# Patient Record
Sex: Female | Born: 1995 | Race: White | Hispanic: No | Marital: Single | Smoking: Never smoker
Health system: Southern US, Community
[De-identification: ages and names within clinical notes are randomized; demographics above are authoritative.]

## PROBLEM LIST (undated history)

## (undated) ENCOUNTER — Emergency Department (HOSPITAL_COMMUNITY): Admission: EM | Payer: BC Managed Care – PPO | Source: Home / Self Care

## (undated) DIAGNOSIS — F419 Anxiety disorder, unspecified: Secondary | ICD-10-CM

## (undated) DIAGNOSIS — F329 Major depressive disorder, single episode, unspecified: Secondary | ICD-10-CM

## (undated) DIAGNOSIS — F32A Depression, unspecified: Secondary | ICD-10-CM

## (undated) DIAGNOSIS — J45909 Unspecified asthma, uncomplicated: Secondary | ICD-10-CM

## (undated) DIAGNOSIS — K219 Gastro-esophageal reflux disease without esophagitis: Secondary | ICD-10-CM

## (undated) HISTORY — PX: HYMENECTOMY: SHX987

## (undated) HISTORY — PX: OTHER SURGICAL HISTORY: SHX169

## (undated) HISTORY — DX: Unspecified asthma, uncomplicated: J45.909

## (undated) HISTORY — PX: PILONIDAL CYST EXCISION: SHX744

## (undated) HISTORY — DX: Gastro-esophageal reflux disease without esophagitis: K21.9

---

## 2018-07-10 ENCOUNTER — Encounter (HOSPITAL_COMMUNITY): Payer: Self-pay

## 2018-07-10 ENCOUNTER — Other Ambulatory Visit: Payer: Self-pay

## 2018-07-10 ENCOUNTER — Inpatient Hospital Stay (HOSPITAL_COMMUNITY)
Admission: RE | Admit: 2018-07-10 | Discharge: 2018-07-13 | DRG: 885 | Disposition: A | Discharge status: Home / Self Care | Payer: No Typology Code available for payment source | Attending: Psychiatry | Admitting: Psychiatry

## 2018-07-10 DIAGNOSIS — R45851 Suicidal ideations: Secondary | ICD-10-CM | POA: Diagnosis present

## 2018-07-10 DIAGNOSIS — G4700 Insomnia, unspecified: Secondary | ICD-10-CM | POA: Diagnosis present

## 2018-07-10 DIAGNOSIS — F419 Anxiety disorder, unspecified: Secondary | ICD-10-CM | POA: Diagnosis not present

## 2018-07-10 DIAGNOSIS — J45909 Unspecified asthma, uncomplicated: Secondary | ICD-10-CM | POA: Diagnosis present

## 2018-07-10 DIAGNOSIS — F322 Major depressive disorder, single episode, severe without psychotic features: Principal | ICD-10-CM | POA: Diagnosis present

## 2018-07-10 DIAGNOSIS — F329 Major depressive disorder, single episode, unspecified: Secondary | ICD-10-CM | POA: Diagnosis present

## 2018-07-10 DIAGNOSIS — Z818 Family history of other mental and behavioral disorders: Secondary | ICD-10-CM

## 2018-07-10 DIAGNOSIS — F1210 Cannabis abuse, uncomplicated: Secondary | ICD-10-CM | POA: Diagnosis present

## 2018-07-10 DIAGNOSIS — F410 Panic disorder [episodic paroxysmal anxiety] without agoraphobia: Secondary | ICD-10-CM | POA: Diagnosis present

## 2018-07-10 HISTORY — DX: Major depressive disorder, single episode, unspecified: F32.9

## 2018-07-10 HISTORY — DX: Anxiety disorder, unspecified: F41.9

## 2018-07-10 HISTORY — DX: Depression, unspecified: F32.A

## 2018-07-10 MED ORDER — HYDROXYZINE HCL 25 MG PO TABS
25.0000 mg | ORAL_TABLET | Freq: Three times a day (TID) | ORAL | Status: DC | PRN
  Administered 2018-07-11: 25 mg via ORAL
  Filled 2018-07-10 (×2): qty 10
  Filled 2018-07-10: qty 1

## 2018-07-10 MED ORDER — ESCITALOPRAM OXALATE 20 MG PO TABS
20.0000 mg | ORAL_TABLET | Freq: Every day | ORAL | Status: DC
  Administered 2018-07-11: 20 mg via ORAL
  Filled 2018-07-10 (×2): qty 1

## 2018-07-10 MED ORDER — ACETAMINOPHEN 325 MG PO TABS: 650.0000 mg | ORAL_TABLET | Freq: Four times a day (QID) | ORAL | Status: DC | PRN

## 2018-07-10 MED ORDER — TRAZODONE HCL 50 MG PO TABS: 50.0000 mg | ORAL_TABLET | Freq: Every evening | ORAL | Status: DC | PRN

## 2018-07-10 MED ORDER — ALBUTEROL SULFATE (5 MG/ML) 0.5% IN NEBU
3.0000 mL | INHALATION_SOLUTION | RESPIRATORY_TRACT | Status: DC | PRN
  Filled 2018-07-10: qty 3

## 2018-07-10 MED ORDER — MAGNESIUM HYDROXIDE 400 MG/5ML PO SUSP: 30.0000 mL | Freq: Every day | ORAL | Status: DC | PRN

## 2018-07-10 MED ORDER — LORATADINE 10 MG PO TABS
10.0000 mg | ORAL_TABLET | Freq: Every day | ORAL | Status: DC
  Administered 2018-07-11 – 2018-07-13 (×3): 10 mg via ORAL
  Filled 2018-07-10 (×5): qty 1

## 2018-07-10 MED ORDER — ALUM & MAG HYDROXIDE-SIMETH 200-200-20 MG/5ML PO SUSP: 30.0000 mL | ORAL | Status: DC | PRN

## 2018-07-10 NOTE — Progress Notes (Signed)
Adult Psychoeducational Group Note  Date:  07/10/2018 Time:  9:01 PM  Group Topic/Focus:  Wrap-Up Group:   The focus of this group is to help patients review their daily goal of treatment and discuss progress on daily workbooks.  Participation Level:  Active  Participation Quality:  Appropriate  Affect:  Appropriate  Cognitive:  Alert  Insight: Appropriate  Engagement in Group:  Engaged  Modes of Intervention:  Discussion  Additional Comments:  Patient stated having a good day. Patient's goal is to work on herself.   Margrette Wynia L Gerica Koble 07/10/2018, 9:01 PM

## 2018-07-10 NOTE — Tx Team (Signed)
Initial Treatment Plan 07/10/2018 7:30 PM Ambriella Hofacker OMB:[id number]    PATIENT STRESSORS: Educational Concerns Financial Difficulties Health Issues   PATIENT STRENGTHS: Ability for insight Active sense of humor Average or above average intelligence Capable of independent living Communication skills Financial means General fund of knowledge Motivation for treatment/growth Physical Health Special hobby/interest Supportive family/friends Work skills   PATIENT IDENTIFIED PROBLEMS: "work on Pharmacologist"  Suicide Risk  Depression                 DISCHARGE CRITERIA:  Ability to meet basic life and health needs Adequate post-discharge living arrangements Improved stabilization in mood, thinking, and/or behavior Medical problems require only outpatient monitoring Motivation to continue treatment in a less acute level of care Need for constant or close observation no longer present Reduction of life-threatening or endangering symptoms to within safe limits Safe-care adequate arrangements made Verbal commitment to aftercare and medication compliance  PRELIMINARY DISCHARGE PLAN: Outpatient therapy  PATIENT/FAMILY INVOLVEMENT: This treatment plan has been presented to and reviewed with the patient, Theone Murdoch.  The patient and family have been given the opportunity to ask questions and make suggestions.  Ferrel Logan, RN 07/10/2018, 7:30 PM

## 2018-07-10 NOTE — Progress Notes (Addendum)
D: Pt was in hallway upon initial approach.  Pt presents with depressed affect and mood.  She reports her day "could be better."  Pt denies having a goal so Probation officer and pt made goal for pt to sleep well and be safe.  Pt denies SI/HI, denies hallucinations, denies pain.  Pt has been visible in milieu interacting with peers and staff appropriately.  Pt attended evening group.    A: Introduced self to pt.  Met with pt 1:1.  Actively listened to pt and offered support and encouragement.  Urine specimen obtained.  Q15 minute safety checks maintained.  R: Pt is safe on the unit.  Pt verbally contracts for safety.  Will continue to monitor and assess.

## 2018-07-10 NOTE — H&P (Signed)
Behavioral Health Medical Screening Exam  Wanda Huynh is an 23 y.o. female.  Total Time spent with patient: 20 minutes  Psychiatric Specialty Exam: Physical Exam  Nursing note and vitals reviewed. Constitutional: She is oriented to person, place, and time. She appears well-developed and well-nourished.  Cardiovascular: Normal rate.  Respiratory: Effort normal.  Musculoskeletal: Normal range of motion.  Neurological: She is alert and oriented to person, place, and time.  Skin: Skin is warm.    Review of Systems  Constitutional: Negative.   HENT: Negative.   Eyes: Negative.   Respiratory: Negative.   Cardiovascular: Negative.   Gastrointestinal: Negative.   Genitourinary: Negative.   Musculoskeletal: Negative.   Skin: Negative.   Neurological: Negative.   Endo/Heme/Allergies: Negative.   Psychiatric/Behavioral: Positive for depression and suicidal ideas.    Blood pressure (!) 143/100, pulse 82, temperature 98.2 F (36.8 C), resp. rate 16, SpO2 99 %.There is no height or weight on file to calculate BMI.  General Appearance: Disheveled  Eye Contact:  Fair  Speech:  Clear and Coherent and Normal Rate  Volume:  Decreased  Mood:  Depressed  Affect:  Depressed and Flat  Thought Process:  Linear and Descriptions of Associations: Intact  Orientation:  Full (Time, Place, and Person)  Thought Content:  WDL  Suicidal Thoughts:  Yes.  with intent/plan  Homicidal Thoughts:  No  Memory:  Immediate;   Good Recent;   Good Remote;   Good  Judgement:  Fair  Insight:  Fair  Psychomotor Activity:  Normal  Concentration: Concentration: Good and Attention Span: Good  Recall:  Good  Fund of Knowledge:Good  Language: Good  Akathisia:  No  Handed:  Right  AIMS (if indicated):     Assets:  Communication Skills Desire for Improvement Financial Resources/Insurance Housing Physical Health Social Support Transportation  Sleep:       Musculoskeletal: Strength & Muscle  Tone: within normal limits Gait & Station: normal Patient leans: N/A  Blood pressure (!) 143/100, pulse 82, temperature 98.2 F (36.8 C), resp. rate 16, SpO2 99 %.  Recommendations:  Based on my evaluation the patient does not appear to have an emergency medical condition.  Wanda Burdock Georgeanne Frankland, FNP 07/10/2018, 5:10 PM

## 2018-07-10 NOTE — Progress Notes (Signed)
Patient ID: Wanda Huynh, female   DOB: 02-06-1996, 23 y.o.   MRN: [id number]  Admission Note  D) Patient admitted to the adult unit 400 hall. Patient is 23 year old female who is voluntary as a walk-in. 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 Patient reports she had no plan. Patient denies HI/AVH. Patient states she had a history of depression and anxiety and is currently prescribed Lexapro and Vistaril. Patient reports she became overwhelmed with school and life and felt like "ending it all". Patient denies legal issues or allergies to food/medicine. Patient states she is being treated for bronchitis. Patient states her goals for treatment are to "work on coping skills". Patient reports she did not expect to be admitted to [facility] and is anxious about being here. Emotional support provided.  Skin assessment was completed and unremarkable except for several small tattoos. Patient had no belongings secured on admission. Vital signs obtained. Snacks and fluids offered.    A) Plan of care, unit policies and patient expectations were explained. Written consents obtained. Patient oriented to the unit and their room. Patient placed on standard q15 safety checks. Low fall risk precautions initiated and reviewed with patient.   R) Patient is in no acute distress and verbalizes understanding of information provided. Patient with no concerns at this time. Patient contracts for safety with staff on the unit.
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
## 2018-07-10 NOTE — Progress Notes (Signed)
Patient ID: Wanda Huynh, female   DOB: [birth year], 23 y.o.   MRN: [id number]  Urine specimen cup provided to patient for collection.

## 2018-07-11 DIAGNOSIS — G4700 Insomnia, unspecified: Secondary | ICD-10-CM

## 2018-07-11 DIAGNOSIS — F419 Anxiety disorder, unspecified: Secondary | ICD-10-CM

## 2018-07-11 DIAGNOSIS — F322 Major depressive disorder, single episode, severe without psychotic features: Principal | ICD-10-CM

## 2018-07-11 LAB — CBC
HCT: 49.9 % — ABNORMAL HIGH (ref 36.0–46.0)
Hemoglobin: 16.3 g/dL — ABNORMAL HIGH (ref 12.0–15.0)
MCH: 32.7 pg (ref 26.0–34.0)
MCHC: 32.7 g/dL (ref 30.0–36.0)
MCV: 100 fL (ref 80.0–100.0)
NRBC: 0 % (ref 0.0–0.2)
Platelets: 387 10*3/uL (ref 150–400)
RBC: 4.99 MIL/uL (ref 3.87–5.11)
RDW: 11.9 % (ref 11.5–15.5)
WBC: 7.7 10*3/uL (ref 4.0–10.5)

## 2018-07-11 LAB — PREGNANCY, URINE: Preg Test, Ur: NEGATIVE

## 2018-07-11 LAB — COMPREHENSIVE METABOLIC PANEL
ALT: 53 U/L — ABNORMAL HIGH (ref 0–44)
AST: 42 U/L — ABNORMAL HIGH (ref 15–41)
Albumin: 4.5 g/dL (ref 3.5–5.0)
Alkaline Phosphatase: 62 U/L (ref 38–126)
Anion gap: 14 (ref 5–15)
BUN: 17 mg/dL (ref 6–20)
CO2: 25 mmol/L (ref 22–32)
Calcium: 9.8 mg/dL (ref 8.9–10.3)
Chloride: 102 mmol/L (ref 98–111)
Creatinine, Ser: 0.88 mg/dL (ref 0.44–1.00)
GFR calc Af Amer: >60 mL/min (ref >60)
GFR calc non Af Amer: >60 mL/min (ref >60)
Glucose, Bld: 79 mg/dL (ref 70–99)
POTASSIUM: 3.6 mmol/L (ref 3.5–5.1)
Sodium: 141 mmol/L (ref 135–145)
Total Bilirubin: 0.7 mg/dL (ref 0.3–1.2)
Total Protein: 8.4 g/dL — ABNORMAL HIGH (ref 6.5–8.1)

## 2018-07-11 LAB — TSH: TSH: 1.268 u[IU]/mL (ref 0.350–4.500)

## 2018-07-11 LAB — RAPID URINE DRUG SCREEN, HOSP PERFORMED
Amphetamines: NOT DETECTED
BENZODIAZEPINES: NOT DETECTED
Barbiturates: NOT DETECTED
Cocaine: NOT DETECTED
Opiates: NOT DETECTED
Tetrahydrocannabinol: POSITIVE — AB

## 2018-07-11 MED ORDER — MIRTAZAPINE 7.5 MG PO TABS
7.5000 mg | ORAL_TABLET | Freq: Every day | ORAL | Status: DC
  Administered 2018-07-11 – 2018-07-12 (×2): 7.5 mg via ORAL
  Filled 2018-07-11: qty 1
  Filled 2018-07-11: qty 7
  Filled 2018-07-11 (×2): qty 1

## 2018-07-11 MED ORDER — KETOCONAZOLE 2 % EX SHAM: MEDICATED_SHAMPOO | Freq: Every evening | CUTANEOUS | Status: DC | PRN

## 2018-07-11 MED ORDER — ESCITALOPRAM OXALATE 10 MG PO TABS
10.0000 mg | ORAL_TABLET | Freq: Every day | ORAL | Status: DC
  Administered 2018-07-12 – 2018-07-13 (×2): 10 mg via ORAL
  Filled 2018-07-11: qty 1
  Filled 2018-07-11: qty 7
  Filled 2018-07-11 (×3): qty 1

## 2018-07-11 NOTE — Progress Notes (Signed)
D: Pt was in dayroom upon initial approach.  Pt presents with appropriate affect and mood.  She was seen smiling and laughing at times in dayroom tonight.  Describes her day as "pretty good."  Reports she had a good visit with her best friends and parents tonight.  Her goal was to "talk in group therapy.  I met it."  Reports the best part of her day was seeing her best friends.  Pt denies SI/HI, denies hallucinations, denies pain.  Pt has been visible in milieu interacting with peers and staff appropriately.  Pt attended evening group.    A: Met with pt 1:1.  Actively listened to pt and offered support and encouragement. Medication administered per order.  PRN medication administered for scalp irritation.  Q15 minute safety checks maintained.  R: Pt is safe on the unit.  Pt is compliant with medications.  Pt verbally contracts for safety.  Will continue to monitor and assess.

## 2018-07-11 NOTE — [facility] Suicide Risk Assessment (Signed)
[facility] Admission Suicide Risk Assessment   Nursing information obtained from:  Patient, Review of record Demographic factors:  Adolescent or young adult, Caucasian Current Mental Status:  Suicidal ideation indicated by patient, Self-harm thoughts Loss Factors:  NA Historical Factors:  Family history of suicide, Family history of mental illness or substance abuse Risk Reduction Factors:  Living with another person, especially a relative, Positive social support, Sense of responsibility to family, Employed, Positive coping skills or problem solving skills  Total Time spent with patient: 45 minutes Principal Problem: MDD, Suicidal Ideations, Cannabis Use Disorder  Diagnosis:  Active Problems:   MDD (major depressive disorder), severe (HCC)  Subjective Data:  Continued Clinical Symptoms:  Alcohol Use Disorder Identification Test Final Score (AUDIT): 0 The "Alcohol Use Disorders Identification Test", Guidelines for Use in Primary Care, Second Edition.  World Science writer [facility]). Score between 0-7:  no or low risk or alcohol related problems. Score between 8-15:  moderate risk of alcohol related problems. Score between 16-19:  high risk of alcohol related problems. Score 20 or above:  warrants further diagnostic evaluation for alcohol dependence and treatment.   CLINICAL FACTORS:  23 year old female, college student, employed .  Reports history of depression, anxiety, suicidal ideations, endorses some neuro-vegetative symptoms , worsening anxiety. Uses Cannabis on a daily basis.   Psychiatric Specialty Exam: Physical Exam  ROS  Blood pressure 128/75, pulse 79, temperature 97.8 F (36.6 C), temperature source Oral, resp. rate 16, height 5\' 7"  (1.702 m), weight 71.2 kg, SpO2 99 %.Body mass index is 24.59 kg/m.  See admit note MSE   COGNITIVE FEATURES THAT CONTRIBUTE TO RISK:  Closed-mindedness and Loss of executive function    SUICIDE RISK:   Moderate:  Frequent suicidal ideation  with limited intensity, and duration, some specificity in terms of plans, no associated intent, good self-control, limited dysphoria/symptomatology, some risk factors present, and identifiable protective factors, including available and accessible social support.  PLAN OF CARE: Patient will be admitted to inpatient psychiatric unit for stabilization and safety. Will provide and encourage milieu participation. Provide medication management and maked adjustments as needed.  Will follow daily.    I certify that inpatient services furnished can reasonably be expected to improve the patient's condition.   Craige Cotta, MD 07/11/2018, 9:18 AM

## 2018-07-11 NOTE — [facility] Group Notes (Signed)
Adult Psychoeducational Group Note  Date:  07/11/2018 Time:  9:08 PM  Group Topic/Focus:  Wrap-Up Group:   The focus of this group is to help patients review their daily goal of treatment and discuss progress on daily workbooks.  Participation Level:  Active  Participation Quality:  Appropriate  Affect:  Appropriate  Cognitive:  Appropriate  Insight: Good  Engagement in Group:  Engaged  Modes of Intervention:  Discussion  Additional Comments:  Pt rated her day 6 because of her anxiety other than that it was good.  Her goal was to talk during group and goals were met.  Eldine Rencher A 07/11/2018, 9:08 PM
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
## 2018-07-11 NOTE — H&P (Signed)
Psychiatric Admission Assessment Adult  Patient Identification: Wanda Huynh MRN:  [id number] Date of Evaluation:  07/11/2018 Chief Complaint: " I was having suicidal thoughts " Principal Diagnosis: MDD, Cannabis Use Disorder  Diagnosis:  Active Problems:   MDD (major depressive disorder), severe (HCC)  History of Present Illness: 23 year old female, college student, employed. She presented to hospital voluntarily. States she came to hospital at the encouragement of her sister. Reports history of depression and suicidal ideations, which have been occurring for several months, but which she feels have worsened recently. States she has had thoughts of overdosing or of jumping from a moving vehicle.  She denies any suicidal attempts.  In addition to depression she also reports worsening anxiety and states she has been worrying excessively, some panic attacks. Endorses neuro-vegetative symptoms as below, but does not endorse anhedonia. Denies psychotic symptoms. She attributes worsening depression to different factors - states she has feelings for a good friend who did not correspond , her father having " anger problems and being verbally abusive , manipulative".  Reports history of Cannabis Use Disorder, and states she has been smoking cannabis daily.  Associated Signs/Symptoms: Depression Symptoms:  depressed mood, insomnia, suicidal thoughts with specific plan, anxiety, loss of energy/fatigue, decreased appetite, has lost about 12 lbs over the last month (Hypo) Manic Symptoms: does not endorse  Anxiety Symptoms:  She reports increased anxiety, and describes both increased worrying and panic attacks. Psychotic Symptoms: denies  PTSD Symptoms: Does not endorse PTSD symptoms  Total Time spent with patient: 45 minutes  Past Psychiatric History: no prior psychiatric admissions . States she has never attempted suicide and denies any history of self cutting or self injurious  ideations . Denies history of psychosis.  She reports history of depression in the past, but states it became worse over the last year or two.  Reports history of anxiety and panic attacks. Denies agoraphobia. Describes some symptoms of social anxiety. Denies history of mania or hypomania, denies history of Eating Disorder, denies history of violence . She had gone to [facility] and had been tried on Prozac , which she took for a month, but states she did not feel it worked. More recently she was switched to Lexapro, which she has taken for 3-4 weeks. She states she does not feel medication is helping significantly thus far, but does not endorse side effects.   Is the patient at risk to self? Yes.    Has the patient been a risk to self in the past 6 months? Yes.    Has the patient been a risk to self within the distant past? No.  Is the patient a risk to others? No.  Has the patient been a risk to others in the past 6 months? No.  Has the patient been a risk to others within the distant past? No.   Prior Inpatient Therapy: Prior Inpatient Therapy: No Prior Outpatient Therapy: Prior Outpatient Therapy: Yes Prior Therapy Dates: 2019/20 Prior Therapy Facilty/Provider(s): [employer] [facility] Reason for Treatment: Depression/Anxiety Does patient have an ACCT team?: No Does patient have Intensive In-House Services?  : No Does patient have [facility] services? : No Does patient have P4CC services?: No  Alcohol Screening: 1. How often do you have a drink containing alcohol?: Never 2. How many drinks containing alcohol do you have on a typical day when you are drinking?: 1 or 2 3. How often do you have six or more drinks on one occasion?: Never AUDIT-C Score:  0 9. Have you or someone else been injured as a result of your drinking?: No 10. Has a relative or friend or a doctor or another health worker been concerned about your drinking or suggested you cut down?: No Alcohol Use Disorder  Identification Test Final Score (AUDIT): 0 Intervention/Follow-up: AUDIT Score <7 follow-up not indicated Substance Abuse History in the last 12 months:  Reports cannabis abuse, has been smoking daily. Denies alcohol abuse . Denies other drug abuse. Consequences of Substance Abuse: Denies  Previous Psychotropic Medications: reports she has been on Lexapro x 1 month, also takes Hydroxyzine at [facility] PRN. Denies side effects. She has been on Prozac in the past , but did not feel it helped. Psychological Evaluations:  No  Past Medical History:  Reports history of asthma and has been told she has hypercholesterolemia. NKDA. Past Medical History:  Diagnosis Date  . Anxiety   . Depression    History reviewed. No pertinent surgical history. Family History: parents alive , live together. Has a fraternal twin sister  Family Psychiatric  History: sister has history of PTSD and depression, has attempted suicide . Grandmother alcoholic.  Tobacco Screening: Have you used any form of tobacco in the last 30 days? 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 Social History   Substance and Sexual Activity  Alcohol Use Not Currently     Social History   Substance and Sexual Activity  Drug Use Yes  . Types: Marijuana    Additional Social History: Marital status: Single    Pain Medications: See MAR Prescriptions: See MAR Over the Counter: See MAR History of alcohol / drug use?: Yes Name of Substance 1: Cannabis 1 - Age of First Use: 21 1 - Amount (size/oz): Blunts 1 - Frequency: Daily 1 - Duration: Ongoing 1 - Last Use / Amount: 07/10/18  Allergies:  No Known Allergies Lab Results: No results found for this or any previous visit (from the past 48 hour(s)).  Blood Alcohol level:  No results found for: [facility]  Metabolic Disorder  Labs:  No results found for: HGBA1C, MPG No results found for: PROLACTIN No results found for: CHOL, TRIG, HDL, CHOLHDL, VLDL, LDLCALC  Current Medications: Current Facility-Administered Medications  Medication Dose Route Frequency Provider Last Rate Last Dose  . acetaminophen (TYLENOL) tablet 650 mg  650 mg Oral Q6H PRN Money, Gerlene Burdockravis B, FNP      . albuterol (PROVENTIL) (5 MG/ML) 0.5% nebulizer solution 3 mL  3 mL Inhalation Q4H PRN Money, Gerlene Burdockravis B, FNP      . alum & mag hydroxide-simeth (MAALOX/MYLANTA) 200-200-20 MG/5ML suspension 30 mL  30 mL Oral Q4H PRN Money, Gerlene Burdockravis B, FNP      . escitalopram (LEXAPRO) tablet 20 mg  20 mg Oral Daily Money, Gerlene Burdockravis B, FNP   20 mg at 07/11/18 [id number]  . hydrOXYzine (ATARAX/VISTARIL) tablet 25 mg  25 mg Oral TID PRN Money, Gerlene Burdockravis B, FNP      . loratadine (CLARITIN) tablet 10 mg  10 mg Oral Daily Money, Gerlene Burdockravis B, FNP   10 mg at 07/11/18 0811  . magnesium hydroxide (MILK OF MAGNESIA) suspension 30 mL  30 mL Oral Daily PRN Money, Feliz Beamravis B, FNP      . traZODone (DESYREL) tablet 50 mg  50 mg Oral QHS PRN Money, Gerlene Burdockravis B, FNP  PTA Medications: No medications prior to admission.    Musculoskeletal: Strength & Muscle Tone: within normal limits Gait & Station: normal Patient leans: N/A  Psychiatric Specialty Exam: Physical Exam  Review of Systems  Constitutional: Positive for weight loss. Negative for chills and fever.  HENT: Negative.   Eyes: Negative.   Respiratory: Negative.   Cardiovascular: Negative.   Gastrointestinal: Positive for diarrhea and nausea. Negative for abdominal pain and vomiting.  Genitourinary: Negative.   Musculoskeletal: Negative.   Skin: Negative.   Neurological: Negative for seizures and headaches.  Endo/Heme/Allergies: Negative.   Psychiatric/Behavioral: Positive for depression, substance abuse and suicidal ideas. The patient is nervous/anxious.     Blood pressure 128/75, pulse 79, temperature 97.8 F (36.6 C),  temperature source Oral, resp. rate 16, height 5\' 7"  (1.702 m), weight 71.2 kg, SpO2 99 %.Body mass index is 24.59 kg/m.  General Appearance: Well Groomed  Eye Contact:  Good  Speech:  Normal Rate  Volume:  Normal  Mood:  reports she is feeling better than on admission and currently describes mood as 6/10 with 10 best   Affect:  vaguely anxious, constricted, improves during session, smiles at times appropriately   Thought Process:  Linear and Descriptions of Associations: Intact  Orientation:  Other:  fully alert and attentive  Thought Content:  denies hallucinations, no delusions   Suicidal Thoughts:  No denies any current suicidal ideations or self injurious ideations and contracts for safety on unit  Homicidal Thoughts:  No Denies any homicidal or violent ideations  Memory:  recent and remote grossly intact   Judgement:  Fair  Insight:  Fair  Psychomotor Activity:  Normal  Concentration:  Concentration: Good and Attention Span: Good  Recall:  Good  Fund of Knowledge:  Good  Language:  Good  Akathisia:  Negative  Handed:  Right  AIMS (if indicated):     Assets:  Communication Skills Desire for Improvement Resilience  ADL's:  Intact  Cognition:  WNL  Sleep:  Number of Hours: 6.75    Treatment Plan Summary: Daily contact with patient to assess and evaluate symptoms and progress in treatment, Medication management, Plan inpatient admission and medications as below  Observation Level/Precautions:  15 minute checks  Laboratory:  As needed   Psychotherapy:  Milieu, group therapy  Medications:  We discussed medication options- Agrees to Remeron trial , which may help address insomnia and poor appetite as well- side effects discussed, including risk of increased suicidal ideations early in treatment with antidepressants in young adults  Continue Lexapro at 10 mgrs QDAY. D/C Trazodone  Start Remeron 7.5 mgrs QHS Continue Vistaril 25 mgrs Q 8 hours PRN for anxiety  Consultations:  as needed   Discharge Concerns:-    Estimated LOS:  4 days  Other:     Physician Treatment Plan for Primary Diagnosis:  MDD  Long Term Goal(s): Improvement in symptoms so as ready for discharge  Short Term Goals: Ability to identify changes in lifestyle to reduce recurrence of condition will improve and Ability to maintain clinical measurements within normal limits will improve  Physician Treatment Plan for Secondary Diagnosis: Suicidal Ideations Long Term Goal(s): Improvement in symptoms so as ready for discharge  Short Term Goals: Ability to identify changes in lifestyle to reduce recurrence of condition will improve, Ability to verbalize feelings will improve, Ability to disclose and discuss suicidal ideas, Ability to demonstrate self-control will improve, Ability to identify and develop effective coping behaviors will improve and Ability to maintain clinical measurements  within normal limits will improve  I certify that inpatient services furnished can reasonably be expected to improve the patient's condition.    Craige CottaFernando A Cobos, MD 1/11/20208:46 AM

## 2018-07-11 NOTE — Progress Notes (Signed)
Patient ID: Wanda Huynh, female   DOB: 12/10/1995, 23 y.o.   MRN: [id number]  Pt currently presents with a flat affect and cooperative behavior. Pt reports to writer that their goal is to "be around other people." Pt states "I'm going to all the groups today." Pt reports good sleep with current medication regimen.  Pt provided with medications per providers orders. Pt's labs and vitals were monitored throughout the night. Pt supported emotionally and encouraged to express concerns and questions. Pt educated on medications and anxiety coping skills.  Pt's safety ensured with 15 minute and environmental checks. Pt currently denies SI/HI and A/V hallucinations. Pt verbally agrees to seek staff if SI/HI or A/VH occurs and to consult with staff before acting on any harmful thoughts. Will continue POC.

## 2018-07-12 ENCOUNTER — Other Ambulatory Visit: Payer: Self-pay

## 2018-07-12 MED ORDER — NORGESTIM-ETH ESTRAD TRIPHASIC 0.18/0.215/0.25 MG-25 MCG PO TABS
1.0000 | ORAL_TABLET | Freq: Every day | ORAL | Status: DC
  Administered 2018-07-12 – 2018-07-13 (×2): 1 via ORAL

## 2018-07-12 NOTE — Plan of Care (Signed)
D: Pt presents with a animated affect and an anxious mood. Pt rated on her self inventory sheet: depression 1/10, anxiety 1/10 and hopelessness 4/10. Pt denies SI/HI. Pt reported fair sleep last night. Pt reported having a good appetite today. Pt compliant with taking meds and no side effects to meds verbalized by pt.  A: Medications reviewed with pt. Medications administered as ordered per MD. Verbal support provided. V/s assessed. 15 minute checks performed for safety. Birth controls pills verified per Pharm D.   R: Pt compliant with tx plan. Pt stated goal "work on my anxiety level."   Care-Plan Problem: Education: Goal: Emotional status will improve Outcome: Progressing   Problem: Activity: Goal: Interest or engagement in activities will improve Outcome: Progressing   Problem: Coping: Goal: Ability to demonstrate self-control will improve Outcome: Progressing

## 2018-07-12 NOTE — Plan of Care (Signed)
  Problem: Education: Goal: Knowledge of [facility] Education information/materials will improve Outcome: Progressing Goal: Emotional status will improve Outcome: Progressing Goal: Mental status will improve Outcome: Progressing Goal: Verbalization of understanding the information provided will improve Outcome: Progressing   Problem: Activity: Goal: Interest or engagement in activities will improve Outcome: Progressing Goal: Sleeping patterns will improve Outcome: Progressing   Problem: Coping: Goal: Ability to verbalize frustrations and anger appropriately will improve Outcome: Progressing Goal: Ability to demonstrate self-control will improve Outcome: Progressing   Problem: Health Behavior/Discharge Planning: Goal: Identification of resources available to assist in meeting health care needs will improve Outcome: Progressing Goal: Compliance with treatment plan for underlying cause of condition will improve Outcome: Progressing   

## 2018-07-12 NOTE — Progress Notes (Signed)
D: Pt denies SI/HI/AV hallucinations. Pt is pleasant and cooperative. Pt goal for today is to work on her anxiety. A: Pt was offered support and encouragement. Pt was given scheduled medications. Pt was encourage to attend groups. Q 15 minute checks were done for safety.  R:Pt attends groups and interacts well with peers and staff. Pt is taking medication. Pt has no complaints.Pt receptive to treatment and safety maintained on unit.

## 2018-07-12 NOTE — [facility] Group Notes (Signed)
[facility] LCSW Group Therapy Note  07/12/2018   10:00-11:00AM  Type of Therapy and Topic:  Group Therapy:  Unhealthy versus Healthy Supports, Which Am I?  Participation Level:  Active   Description of Group:  Patients in this group were introduced to the concept that additional supports including self-support are an essential part of recovery.  Initially a discussion was held about the differences between healthy versus unhealthy supports.  Patients were asked to share what unhealthy supports in their lives need to be addressed, as well as what additional healthy supports could be added for greater help in reaching their goals.   A song entitled "My Own Hero" was played and a group discussion ensued in which patients stated they could relate to the song and it inspired them to realize they have be willing to help themselves in order to succeed, because other people cannot achieve sobriety or stability for them.  We discussed adding a variety of healthy supports to address the various needs in patient lives, including becoming more self-supportive.  Therapeutic Goals: 1)  Highlight the differences between healthy and unhealthy supports 2)  Suggest the importance of being a part of one's own support system 2)  Discuss reasons people in one's life may eventually be unable to be continually supportive  3)  Identify the patient's current support system and   4)  elicit commitments to add healthy supports and to become more conscious of being self-supportive   Summary of Patient Progress:  The patient expressed that the unhealthy support which needs to be addressed includes her twin sister, parents and best friend who try to be support and often are, but need boundaries because they do not like each other and this affects her negatively.  Healthy supports which could be added for increased stability and happiness include herself being more supportive of herself and doing things for herself.  Therapeutic  Modalities:   Motivational Interviewing Activity  Lynnell Chad

## 2018-07-12 NOTE — [facility] Suicide Risk Assessment (Signed)
[facility] INPATIENT:  Family/Significant Other Suicide Prevention Education  Suicide Prevention Education:  Education Completed; sister, [phone], Garth Schlatter has been identified by the patient as the family member/significant other with whom the patient will be residing, and identified as the person(s) who will aid the patient in the event of a mental health crisis (suicidal ideations/suicide attempt).  With written consent from the patient, the family member/significant other has been provided the following suicide prevention education, prior to the and/or following the discharge of the patient.  The suicide prevention education provided includes the following:  Suicide risk factors  Suicide prevention and interventions  National Suicide Hotline telephone number  [facility] assessment telephone number  [facility] Emergency Assistance 911  [facility] and/or Residential Mobile Crisis Unit telephone number  Request made of family/significant other to:  Remove weapons (e.g., guns, rifles, knives), all items previously/currently identified as safety concern.    Remove drugs/medications (over-the-counter, prescriptions, illicit drugs), all items previously/currently identified as a safety concern.  The family member/significant other verbalizes understanding of the suicide prevention education information provided.  The family member/significant other agrees to remove the items of safety concern listed above.  Patient's sister lives in the patient's same apartment complex and checks in on her sister often. No guns or weapons; no safety concerns. Patient's sister hopes the patient can identify coping skills while inpatient.   Darreld Mclean 07/12/2018, 11:30 AM

## 2018-07-12 NOTE — Progress Notes (Signed)
[facility] MD Progress Note  07/12/2018 10:25 AM Wanda Huynh  MRN:  [id number] Subjective: Patient reports she had a good day yesterday and states that today she is feeling noticeably better than she did prior to admission.  Denies suicidal ideations at this time.  Tolerating Lexapro, Remeron combination well thus far. States she had good visit from friends/family yesterday evening and describes a good support network.  Objective: I have reviewed the chart notes and have met with patient.  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  Reports history of depression, anxiety, suicidal ideations, endorses some neuro-vegetative symptoms , worsening anxiety. Uses Cannabis on a daily basis.  Patient is endorsing feeling better than she did prior to admission.  She does present with more reactive affect, smiles at times appropriately.  Has been noted to be interacting appropriately with peers, visible in dayroom. No disruptive or agitated behaviors.  Pleasant on approach.  Denies medication side effects.  Reports she had recently been diagnosed with bronchitis prior to admission and had been started on an antibiotic, does not remember the name, states she had 2 more days of antibiotic therapy at admission, but does not remember name of medication.  Currently no coughing noted, no shortness of breath, no wheezing, no fever.  Pulse ox 100% at room air. We have reviewed negative impact that cannabis use can have not only on her mental health but also on pulmonary function related regular smoking.  Patient expresses motivation and sobriety/abstinence at this time. Labs reviewed-minimally elevated AST and ALT (42, 53 respectively).  Denies any history of liver disease or dysfunction.  She will follow-up with outpatient provider/PCP for further management/monitoring as needed   Principal Problem: MDD, Suicidal Ideations, Cannabis Use Disorder Diagnosis: Active Problems:   MDD (major depressive disorder),  severe ([place])  Total Time spent with patient: 20 minutes  Past Psychiatric History:   Past Medical History:  Past Medical History:  Diagnosis Date  . Anxiety   . Depression    History reviewed. No pertinent surgical history. Family History: History reviewed. No pertinent family history. Family Psychiatric  History:  Social History:  Social History   Substance and Sexual Activity  Alcohol Use Not Currently     Social History   Substance and Sexual Activity  Drug Use Yes  . Types: Marijuana    Social History   Socioeconomic History  . Marital status: Single    Spouse name: Not on file  . Number of children: Not on file  . Years of education: Not on file  . Highest education level: Not on file  Occupational History  . Not on file  Social Needs  . Financial resource strain: Not on file  . Food insecurity:    Worry: Not on file    Inability: Not on file  . Transportation needs:    Medical: Not on file    Non-medical: Not on file  Tobacco Use  . Smoking status: Never Smoker  . Smokeless tobacco: Never Used  Substance and Sexual Activity  . Alcohol use: Not Currently  . Drug use: Yes    Types: Marijuana  . Sexual activity: Not Currently  Lifestyle  . Physical activity:    Days per week: Not on file    Minutes per session: Not on file  . Stress: Not on file  Relationships  . Social connections:    Talks on phone: Not on file    Gets together: Not on file    Attends religious  service: Not on file    Active member of club or organization: Not on file    Attends meetings of clubs or organizations: Not on file    Relationship status: Not on file  Other Topics Concern  . Not on file  Social History Narrative  . Not on file   Additional Social History:    Pain Medications: See MAR Prescriptions: See MAR Over the Counter: See MAR History of alcohol / drug use?: Yes Name of Substance 1: Cannabis 1 - Age of First Use: 21 1 - Amount (size/oz): Blunts 1 -  Frequency: Daily 1 - Duration: Ongoing 1 - Last Use / Amount: 07/10/18  Sleep: Good  Appetite:  Good  Current Medications: Current Facility-Administered Medications  Medication Dose Route Frequency Provider Last Rate Last Dose  . acetaminophen (TYLENOL) tablet 650 mg  650 mg Oral Q6H PRN Money, Lowry Ram, FNP      . albuterol (PROVENTIL) (5 MG/ML) 0.5% nebulizer solution 3 mL  3 mL Inhalation Q4H PRN Money, Lowry Ram, FNP      . alum & mag hydroxide-simeth (MAALOX/MYLANTA) 200-200-20 MG/5ML suspension 30 mL  30 mL Oral Q4H PRN Money, Lowry Ram, FNP      . escitalopram (LEXAPRO) tablet 10 mg  10 mg Oral Daily Cobos, Myer Peer, MD   10 mg at 07/12/18 0803  . hydrOXYzine (ATARAX/VISTARIL) tablet 25 mg  25 mg Oral TID PRN Money, Lowry Ram, FNP   25 mg at 07/11/18 1844  . ketoconazole (NIZORAL) 2 % shampoo   Topical QHS PRN Lindon Romp A, NP      . loratadine (CLARITIN) tablet 10 mg  10 mg Oral Daily Money, Lowry Ram, FNP   10 mg at 07/12/18 0803  . magnesium hydroxide (MILK OF MAGNESIA) suspension 30 mL  30 mL Oral Daily PRN Money, Darnelle Maffucci B, FNP      . mirtazapine (REMERON) tablet 7.5 mg  7.5 mg Oral QHS Cobos, Myer Peer, MD   7.5 mg at 07/11/18 2106  . Norgestimate-Ethinyl Estradiol Triphasic 0.18/0.215/0.25 MG-25 MCG tablet 1 tablet  1 tablet Oral Daily Cobos, Myer Peer, MD   1 tablet at 07/12/18 7654    Lab Results:  Results for orders placed or performed during the hospital encounter of 07/10/18 (from the past 48 hour(s))  Urine rapid drug screen (hosp performed)not at [facility]     Status: Abnormal   Collection Time: 07/10/18  6:33 PM  Result Value Ref Range   Opiates NONE DETECTED NONE DETECTED   Cocaine NONE DETECTED NONE DETECTED   Benzodiazepines NONE DETECTED NONE DETECTED   Amphetamines NONE DETECTED NONE DETECTED   Tetrahydrocannabinol POSITIVE (A) NONE DETECTED   Barbiturates NONE DETECTED NONE DETECTED    Comment: (NOTE) DRUG SCREEN FOR MEDICAL PURPOSES ONLY.  IF CONFIRMATION  IS NEEDED FOR ANY PURPOSE, NOTIFY LAB WITHIN 5 DAYS. LOWEST DETECTABLE LIMITS FOR URINE DRUG SCREEN Drug Class                     Cutoff (ng/mL) Amphetamine and metabolites    1000 Barbiturate and metabolites    200 Benzodiazepine                 650 Tricyclics and metabolites     300 Opiates and metabolites        300 Cocaine and metabolites        300 THC  50 Performed at [facility], [place] [address] [place], [place] [address]   Pregnancy, urine     Status: None   Collection Time: 07/10/18  6:33 PM  Result Value Ref Range   Preg Test, Ur NEGATIVE NEGATIVE    Comment:        THE SENSITIVITY OF THIS METHODOLOGY IS >20 mIU/mL. Performed at [facility], [place] [address] [place], [place] [address]   CBC     Status: Abnormal   Collection Time: 07/11/18  7:49 AM  Result Value Ref Range   WBC 7.7 4.0 - 10.5 K/uL   RBC 4.99 3.87 - 5.11 MIL/uL   Hemoglobin 16.3 (H) 12.0 - 15.0 g/dL   HCT 49.9 (H) 36.0 - 46.0 %   MCV 100.0 80.0 - 100.0 fL   MCH 32.7 26.0 - 34.0 pg   MCHC 32.7 30.0 - 36.0 g/dL   RDW 11.9 11.5 - 15.5 %   Platelets 387 150 - 400 K/uL   nRBC 0.0 0.0 - 0.2 %    Comment: Performed at [facility], [place] [address], [place], [place] [address]  Comprehensive metabolic panel     Status: Abnormal   Collection Time: 07/11/18  7:49 AM  Result Value Ref Range   Sodium 141 135 - 145 mmol/L   Potassium 3.6 3.5 - 5.1 mmol/L   Chloride 102 98 - 111 mmol/L   CO2 25 22 - 32 mmol/L   Glucose, Bld 79 70 - 99 mg/dL   BUN 17 6 - 20 mg/dL   Creatinine, Ser 0.88 0.44 - 1.00 mg/dL   Calcium 9.8 8.9 - 10.3 mg/dL   Total Protein 8.4 (H) 6.5 - 8.1 g/dL   Albumin 4.5 3.5 - 5.0 g/dL   AST 42 (H) 15 - 41 U/L   ALT 53 (H) 0 - 44 U/L   Alkaline Phosphatase 62 38 - 126 U/L   Total Bilirubin 0.7 0.3 - 1.2 mg/dL   GFR calc non Af Amer >60 >60 mL/min   GFR calc Af Amer >60 >60 mL/min   Anion gap 14 5 - 15     Comment: Performed at [facility], [place] [address], [place], [place] [address]  TSH     Status: None   Collection Time: 07/11/18  7:49 AM  Result Value Ref Range   TSH 1.268 0.350 - 4.500 uIU/mL    Comment: Performed by a 3rd Generation assay with a functional sensitivity of <=0.01 uIU/mL. Performed at [facility], [place] [address] [place] [address], [place], [place] [address]     Blood Alcohol level:  No results found for: [facility]  Metabolic Disorder Labs: No results found for: HGBA1C, MPG No results found for: PROLACTIN No results found for: CHOL, TRIG, HDL, CHOLHDL, VLDL, LDLCALC  Physical Findings: AIMS: Facial and Oral Movements Muscles of Facial Expression: None, normal Lips and Perioral Area: None, normal Jaw: None, normal Tongue: None, normal,Extremity Movements Upper (arms, wrists, hands, fingers): None, normal Lower (legs, knees, ankles, toes): None, normal, Trunk Movements Neck, shoulders, hips: None, normal, Overall Severity Severity of abnormal movements (highest score from questions above): None, normal Incapacitation due to abnormal movements: None, normal Patient's awareness of abnormal movements (rate only patient's report): No Awareness, Dental Status Current problems with teeth and/or dentures?: No Does patient usually wear dentures?: No  CIWA:    COWS:     Musculoskeletal: Strength & Muscle Tone: within normal limits Gait & Station: normal Patient leans:  N/A  Psychiatric Specialty Exam: Physical Exam  ROS no headache, no chest pain, no shortness of breath, no vomiting, no fever, no chills  Blood pressure 131/81, pulse 87, temperature 97.6 F (36.4 C), temperature source Oral, resp. rate 18, height _0  (1.702 m), weight 71.2 kg, SpO2 100 %.Body mass index is 24.59 kg/m.  General Appearance: Well Groomed  Eye Contact:  Good  Speech:  Normal Rate  Volume:  Normal  Mood:  Reports she is feeling better, currently minimizes  depression  Affect:  Appropriate, reactive, smiles at times appropriately  Thought Process:  Linear and Descriptions of Associations: Intact  Orientation:  Full (Time, Place, and Person)  Thought Content:  No hallucinations, no delusions  Suicidal Thoughts:  No currently denies suicidal ideations, denies self-injurious ideations, also currently denies homicidal or violent ideations, contracts for safety  Homicidal Thoughts:  No  Memory:  Recent and remote grossly intact  Judgement:  Fair/improving  Insight:  Fair/improving  Psychomotor Activity:  Normal-no psychomotor agitation or restlessness  Concentration:  Concentration: Good and Attention Span: Good  Recall:  Good  Fund of Knowledge:  Good  Language:  Good  Akathisia:  Negative  Handed:  Right  AIMS (if indicated):     Assets:  Communication Skills Desire for Improvement Resilience  ADL's:  Intact  Cognition:  WNL  Sleep:  Number of Hours: 6.75   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  Reports history of depression, anxiety, suicidal ideations, endorses some neuro-vegetative symptoms , worsening anxiety. Uses Cannabis on a daily basis.   Patient is presenting with improving mood and range of affect.  States she feels better.  Currently denies suicidal ideations.  She is currently tolerating medications well without side effects. Treatment Plan Summary: Daily contact with patient to assess and evaluate symptoms and progress in treatment, Medication management, Plan Inpatient treatment and Medications as below Encourage group and milieu participation to work on coping skills and symptom reduction Encourage efforts to work on sobriety and abstinence Continue Lexapro 10 mg daily for depression, anxiety Continue Remeron 7.5 mg nightly for depression, anxiety, insomnia Continue Vistaril 25 mg 3 times daily PRN for anxiety as needed Check EKG (routine to monitor QTc interval) Jenne Campus, MD 07/12/2018,  10:25 AM

## 2018-07-13 MED ORDER — HYDROXYZINE HCL 25 MG PO TABS: 25.0000 mg | ORAL_TABLET | Freq: Three times a day (TID) | ORAL | 0 refills | Status: DC | PRN

## 2018-07-13 MED ORDER — ESCITALOPRAM OXALATE 10 MG PO TABS: 10.0000 mg | ORAL_TABLET | Freq: Every day | ORAL | 0 refills | Status: DC

## 2018-07-13 MED ORDER — KETOCONAZOLE 2 % EX SHAM: MEDICATED_SHAMPOO | Freq: Every evening | CUTANEOUS | 0 refills | Status: DC | PRN

## 2018-07-13 MED ORDER — MIRTAZAPINE 7.5 MG PO TABS: 7.5000 mg | ORAL_TABLET | Freq: Every day | ORAL | 0 refills | Status: DC

## 2018-07-13 NOTE — Discharge Summary (Addendum)
Physician Discharge Summary Note  Patient:  Wanda Huynh is an 23 y.o., female MRN:  [id number] DOB:  22-Jun-1996 Patient phone:  [phone] (home)  Patient address:   [address] [place] [address],  Total Time spent with patient: 15 minutes  Date of Admission:  07/10/2018 Date of Discharge: 07/13/2018  Reason for Admission:  Suicidal ideation to overdose or jump from moving vehicle  Principal Problem: MDD (major depressive disorder), severe (HCC) Discharge Diagnoses: Principal Problem:   MDD (major depressive disorder), severe (HCC)   Past Psychiatric History: Per admission H&P: no prior psychiatric admissions . States she has never attempted suicide and denies any history of self cutting or self injurious ideations . Denies history of psychosis.  She reports history of depression in the past, but states it became worse over the last year or two.  Reports history of anxiety and panic attacks. Denies agoraphobia. Describes some symptoms of social anxiety. Denies history of mania or hypomania, denies history of Eating Disorder, denies history of violence . She had gone to [facility] and had been tried on Prozac , which she took for a month, but states she did not feel it worked. More recently she was switched to Lexapro, which she has taken for 3-4 weeks. She states she does not feel medication is helping significantly thus far, but does not endorse side effects  Past Medical History:  Past Medical History:  Diagnosis Date  . Anxiety   . Depression    History reviewed. No pertinent surgical history. Family History: History reviewed. No pertinent family history.   Family Psychiatric  History: Per admission H&P: sister has history of PTSD and depression, has attempted suicide . Grandmother alcoholic.   Social History:  Social History   Substance and Sexual Activity  Alcohol Use Not Currently     Social History   Substance and Sexual Activity  Drug Use Yes   . Types: Marijuana    Social History   Socioeconomic History  . Marital status: Single    Spouse name: Not on file  . Number of children: Not on file  . Years of education: Not on file  . Highest education level: Not on file  Occupational History  . Not on file  Social Needs  . Financial resource strain: Not on file  . Food insecurity:    Worry: Not on file    Inability: Not on file  . Transportation needs:    Medical: Not on file    Non-medical: Not on file  Tobacco Use  . Smoking status: Never Smoker  . Smokeless tobacco: Never Used  Substance and Sexual Activity  . Alcohol use: Not Currently  . Drug use: Yes    Types: Marijuana  . Sexual activity: Not Currently  Lifestyle  . Physical activity:    Days per week: Not on file    Minutes per session: Not on file  . Stress: Not on file  Relationships  . Social connections:    Talks on phone: Not on file    Gets together: Not on file    Attends religious service: Not on file    Active member of club or organization: Not on file    Attends meetings of clubs or organizations: Not on file    Relationship status: Not on file  Other Topics Concern  . Not on file  Social History Narrative  . Not on file    Hospital Course:  Per admission H&P 07/11/2018:  22  year old female, college student, employed. She presented to hospital voluntarily. States she came to hospital at the encouragement of her sister. Reports history of depression and suicidal ideations, which have been occurring for several months, but which she feels have worsened recently. States she has had thoughts of overdosing or of jumping from a moving vehicle.  She denies any suicidal attempts. In addition to depression she also reports worsening anxiety and states she has been worrying excessively, some panic attacks. Endorses neuro-vegetative symptoms as below, but does not endorse anhedonia. Denies psychotic symptoms. She attributes worsening depression to  different factors - states she has feelings for a good friend who did not correspond , her father having " anger problems and being verbally abusive , manipulative". Reports history of Cannabis Use Disorder, and states she has been smoking cannabis daily.  Ms. Wanda Huynh was admitted voluntarily for suicidal ideation with thoughts of overdosing or jumping from a moving vehicle. She reported worsening anxiety and depression related to several stressors, including conflict with her father. Also reported daily THC use; patient stated THC had negative impact on mood overall and expressed desire for sobriety at discharge. Lexapro was continued, and Remeron was added. [facility]. She stabilized with medication and therapy. She was discharged on the medications listed below. She has shown improvement with improved mood, affect, sleep, appetite, and interaction. She denies any SI/HI/AVH and contracts for safety. She agrees to [facility]. She is provided with prescriptions for medications upon discharge. Patient was also instructed to [facility] in 2-4 weeks to recheck liver function labs (AST 42, ALT 53 on 07/11/18).  Physical Findings: AIMS: Facial and Oral Movements Muscles of Facial Expression: None, normal Lips and Perioral Area: None, normal Jaw: None, normal Tongue: None, normal,Extremity Movements Upper (arms, wrists, hands, fingers): None, normal Lower (legs, knees, ankles, toes): None, normal, Trunk Movements Neck, shoulders, hips: None, normal, Overall Severity Severity of abnormal movements (highest score from questions above): None, normal Incapacitation due to abnormal movements: None, normal Patient's awareness of abnormal movements (rate only patient's report): No Awareness, Dental Status Current problems with teeth and/or dentures?: No Does patient usually wear dentures?: No  CIWA:    COWS:  COWS Total Score:  1  Musculoskeletal: Strength & Muscle Tone: within normal limits Gait & Station: normal Patient leans: N/A  Psychiatric Specialty Exam: Physical Exam  Nursing note and vitals reviewed. Constitutional: She is oriented to person, place, and time. She appears well-developed and well-nourished.  Cardiovascular: Normal rate.  Respiratory: Effort normal.  Neurological: She is alert and oriented to person, place, and time.    Review of Systems  Constitutional: Negative.   Respiratory: Negative.   Cardiovascular: Negative.   Psychiatric/Behavioral: Positive for depression (Stable on medication) and substance abuse (hx THC). Negative for hallucinations, memory loss and suicidal ideas. The patient is not nervous/anxious and does not have insomnia.     Blood pressure (!) 141/90, pulse 66, temperature 98 F (36.7 C), temperature source Oral, resp. rate 18, height 5\' 7"  (1.702 m), weight 71.2 kg, SpO2 100 %.Body mass index is 24.59 kg/m.  See MD's discharge SRA     Have you used any form of tobacco in the last 30 days? (Cigarettes, Smokeless Tobacco, Cigars, and/or Pipes): No  Has this patient used any form of tobacco in the last 30 days? (Cigarettes, Smokeless Tobacco, Cigars, and/or Pipes)  No  Blood Alcohol level:  No results found for: [facility]  Metabolic Disorder Labs:  No results found for: HGBA1C, MPG No results found for: PROLACTIN No results found for: CHOL, TRIG, HDL, CHOLHDL, VLDL, LDLCALC  See Psychiatric Specialty Exam and Suicide Risk Assessment completed by Attending Physician prior to discharge.  Discharge destination:  Home  Is patient on multiple antipsychotic therapies at discharge:  No   Has Patient had three or more failed trials of antipsychotic monotherapy by history:  No  Recommended Plan for Multiple Antipsychotic Therapies: NA  Discharge Instructions    Discharge instructions   Complete by:  As directed    [facility] to recheck liver  function labs (AST, ALT) in 2-4 weeks  Take all prescribed medications as recommended. Report any side effects or adverse reactions to your outpatient psychiatrist. Abstain from alcohol and illegal drugs while on prescription medications. In the event of worsening symptoms, call the crisis hotline, 911, or go to the nearest emergency department for evaluation and treatment.     Allergies as of 07/13/2018   No Known Allergies     Medication List    STOP taking these medications   naproxen sodium 220 MG tablet Commonly known as:  ALEVE     TAKE these medications     Indication  albuterol 108 (90 Base) MCG/ACT inhaler Commonly known as:  PROVENTIL HFA;VENTOLIN HFA Inhale 1 puff into the lungs every 6 (six) hours as needed for wheezing or shortness of breath.  Indication:  Shortness of breath   escitalopram 10 MG tablet Commonly known as:  LEXAPRO Take 1 tablet (10 mg total) by mouth daily. For mood Start taking on:  July 14, 2018 What changed:    medication strength  how much to take  additional instructions  Indication:  Mood   hydrOXYzine 25 MG tablet Commonly known as:  ATARAX/VISTARIL Take 1 tablet (25 mg total) by mouth 3 (three) times daily as needed for anxiety. What changed:    when to take this  reasons to take this  Indication:  Feeling Anxious   ketoconazole 2 % shampoo Commonly known as:  NIZORAL Apply topically at bedtime as needed for irritation.  Indication:  Tinea Versicolor   loratadine 10 MG tablet Commonly known as:  CLARITIN Take 10 mg by mouth daily.  Indication:  Allergies   mirtazapine 7.5 MG tablet Commonly known as:  REMERON Take 1 tablet (7.5 mg total) by mouth at bedtime. For sleep/mood  Indication:  Major Depressive Disorder   TRI-LO-MARZIA 0.18/0.215/0.25 MG-25 MCG tab Generic drug:  Norgestimate-Ethinyl Estradiol Triphasic Take 1 tablet by mouth daily.  Indication:  Birth Control Treatment      Follow-up Information     [facility] Follow up on 07/13/2018.   Why:  Your therapy appointment is Wednesday, 1/15 with Darl PikesSusan at 1:00p. Your medication managment is Thursday, 2/6 at 11:00a.  Contact information: [address] [place], [place] [address] Phone: [phone] Fax: [phone]          Follow-up recommendations: [facility] in 2-4 weeks to recheck liver function labs (AST 42, ALT 53 on 07/11/18). Activity as tolerated. Diet as recommended by primary care physician. Keep all scheduled follow-up appointments as recommended.   Comments:   Patient is instructed to take all prescribed medications as recommended. Report any side effects or adverse reactions to your outpatient psychiatrist. Patient is instructed to abstain from alcohol and illegal drugs while on prescription medications. In the event of worsening  symptoms, patient is instructed to call the crisis hotline, 911, or go to the nearest emergency department for evaluation and treatment.  Signed: Aldean Baker, NP 07/13/2018, 3:22 PM   Patient seen, Suicide Assessment Completed.  Disposition Plan Reviewed

## 2018-07-13 NOTE — Progress Notes (Signed)
Recreation Therapy Notes  Date: 1.13.20 Time: 0930 Location: 300 [address] Dayroom  Group Topic: Stress Management  Goal Area(s) Addresses:  Patient will identify stress management techniques. Patient will identify benefits of using stress management techniques post d/c.  Intervention: Stress Management  Activity :  Meditation.  LRT introduced the stress management technique of meditation.  LRT played a meditation on being resilient in the face of adversity.  Patients were to follow along as meditation played to engage in activity.  Education:  Stress Management, Discharge Planning.   Education Outcome: Acknowledges Education  Clinical Observations/Feedback: Pt did not attend group.    Islay Polanco, LRT/CTRS         Morgane Joerger A 07/13/2018 11:56 AM 

## 2018-07-13 NOTE — Progress Notes (Signed)
D:  Patient's self inventory sheet, patient has fair sleep, no sleep medication.  Fair appetite, normal energy level, good concentration.  Denied depression and hopeless and anxiety #1.  Denied withdrawals.  Denied SI.  Denied physical problems.  Denied physical pain.  Goal is focus on herself rather than everyone else.  Plans to notice and recognize her emotions.  Does have discharge plans. A:  Medications administered per MD orders.  Emotional support and encouragement given patient. R:  Denied SI and HI, contracts for safety.  Denied A/V hallucinations.  Safety maintained with 15 minute checks.

## 2018-07-13 NOTE — Tx Team (Signed)
Interdisciplinary Treatment and Diagnostic Plan Update  07/13/2018 Time of Session: 9:00am Wanda Huynh MRN: [id number]  Principal Diagnosis: <principal problem not specified>  Secondary Diagnoses: Active Problems:   MDD (major depressive disorder), severe (HCC)   Current Medications:  Current Facility-Administered Medications  Medication Dose Route Frequency Provider Last Rate Last Dose  . acetaminophen (TYLENOL) tablet 650 mg  650 mg Oral Q6H PRN Money, Gerlene Burdock, FNP      . albuterol (PROVENTIL) (5 MG/ML) 0.5% nebulizer solution 3 mL  3 mL Inhalation Q4H PRN Money, Gerlene Burdock, FNP      . alum & mag hydroxide-simeth (MAALOX/MYLANTA) 200-200-20 MG/5ML suspension 30 mL  30 mL Oral Q4H PRN Money, Feliz Beam B, FNP      . escitalopram (LEXAPRO) tablet 10 mg  10 mg Oral Daily Cobos, Rockey Situ, MD   10 mg at 07/13/18 1031  . hydrOXYzine (ATARAX/VISTARIL) tablet 25 mg  25 mg Oral TID PRN Money, Gerlene Burdock, FNP   25 mg at 07/11/18 1844  . ketoconazole (NIZORAL) 2 % shampoo   Topical QHS PRN Nira Conn A, NP      . loratadine (CLARITIN) tablet 10 mg  10 mg Oral Daily Money, Gerlene Burdock, FNP   10 mg at 07/13/18 5945  . magnesium hydroxide (MILK OF MAGNESIA) suspension 30 mL  30 mL Oral Daily PRN Money, Feliz Beam B, FNP      . mirtazapine (REMERON) tablet 7.5 mg  7.5 mg Oral QHS Cobos, Rockey Situ, MD   7.5 mg at 07/12/18 2100  . Norgestimate-Ethinyl Estradiol Triphasic 0.18/0.215/0.25 MG-25 MCG tablet 1 tablet  1 tablet Oral Daily Cobos, Rockey Situ, MD   1 tablet at 07/13/18 8592   PTA Medications: Medications Prior to Admission  Medication Sig Dispense Refill Last Dose  . albuterol (PROVENTIL HFA;VENTOLIN HFA) 108 (90 Base) MCG/ACT inhaler Inhale 1 puff into the lungs every 6 (six) hours as needed for wheezing or shortness of breath.     . escitalopram (LEXAPRO) 20 MG tablet Take 20 mg by mouth daily.     . hydrOXYzine (ATARAX/VISTARIL) 25 MG tablet Take 25 mg by mouth at bedtime as needed.     .  loratadine (CLARITIN) 10 MG tablet Take 10 mg by mouth daily.     . naproxen sodium (ALEVE) 220 MG tablet Take 220 mg by mouth 2 (two) times daily as needed.     . Norgestimate-Ethinyl Estradiol Triphasic (TRI-LO-MARZIA) 0.18/0.215/0.25 MG-25 MCG tab Take 1 tablet by mouth daily.       Patient Stressors:    Patient Strengths: Ability for insight Active sense of humor Average or above average intelligence Capable of independent living Metallurgist fund of knowledge Motivation for treatment/growth Physical Health Special hobby/interest Supportive family/friends Work skills  Treatment Modalities: Medication Management, Group therapy, Case management,  1 to 1 session with clinician, Psychoeducation, Recreational therapy.   Physician Treatment Plan for Primary Diagnosis: <principal problem not specified> Long Term Goal(s): Improvement in symptoms so as ready for discharge Improvement in symptoms so as ready for discharge   Short Term Goals: Ability to identify changes in lifestyle to reduce recurrence of condition will improve Ability to maintain clinical measurements within normal limits will improve Ability to identify changes in lifestyle to reduce recurrence of condition will improve Ability to verbalize feelings will improve Ability to disclose and discuss suicidal ideas Ability to demonstrate self-control will improve Ability to identify and develop effective coping behaviors will improve Ability to maintain clinical  measurements within normal limits will improve  Medication Management: Evaluate patient's response, side effects, and tolerance of medication regimen.  Therapeutic Interventions: 1 to 1 sessions, Unit Group sessions and Medication administration.  Evaluation of Outcomes: Adequate for Discharge  Physician Treatment Plan for Secondary Diagnosis: Active Problems:   MDD (major depressive disorder), severe (HCC)  Long Term  Goal(s): Improvement in symptoms so as ready for discharge Improvement in symptoms so as ready for discharge   Short Term Goals: Ability to identify changes in lifestyle to reduce recurrence of condition will improve Ability to maintain clinical measurements within normal limits will improve Ability to identify changes in lifestyle to reduce recurrence of condition will improve Ability to verbalize feelings will improve Ability to disclose and discuss suicidal ideas Ability to demonstrate self-control will improve Ability to identify and develop effective coping behaviors will improve Ability to maintain clinical measurements within normal limits will improve     Medication Management: Evaluate patient's response, side effects, and tolerance of medication regimen.  Therapeutic Interventions: 1 to 1 sessions, Unit Group sessions and Medication administration.  Evaluation of Outcomes: Adequate for Discharge   RN Treatment Plan for Primary Diagnosis: <principal problem not specified> Long Term Goal(s): Knowledge of disease and therapeutic regimen to maintain health will improve  Short Term Goals: Ability to remain free from injury will improve, Ability to demonstrate self-control, Ability to verbalize feelings will improve, Ability to disclose and discuss suicidal ideas, Ability to identify and develop effective coping behaviors will improve and Compliance with prescribed medications will improve  Medication Management: RN will administer medications as ordered by provider, will assess and evaluate patient's response and provide education to patient for prescribed medication. RN will report any adverse and/or side effects to prescribing provider.  Therapeutic Interventions: 1 on 1 counseling sessions, Psychoeducation, Medication administration, Evaluate responses to treatment, Monitor vital signs and CBGs as ordered, Perform/monitor CIWA, COWS, AIMS and Fall Risk screenings as ordered, Perform  wound care treatments as ordered.  Evaluation of Outcomes: Adequate for Discharge   LCSW Treatment Plan for Primary Diagnosis: <principal problem not specified> Long Term Goal(s): Safe transition to appropriate next level of care at discharge, Engage patient in therapeutic group addressing interpersonal concerns.  Short Term Goals: Engage patient in aftercare planning with referrals and resources, Increase social support, Increase emotional regulation, Facilitate acceptance of mental health diagnosis and concerns, Identify triggers associated with mental health/substance abuse issues and Increase skills for wellness and recovery  Therapeutic Interventions: Assess for all discharge needs, 1 to 1 time with Social worker, Explore available resources and support systems, Assess for adequacy in community support network, Educate family and significant other(s) on suicide prevention, Complete Psychosocial Assessment, Interpersonal group therapy.  Evaluation of Outcomes: Adequate for Discharge   Progress in Treatment: Attending groups: Yes. Participating in groups: Yes. Taking medication as prescribed: Yes. Toleration medication: Yes. Family/Significant other contact made: Yes, individual(s) contacted:  sisiter Patient understands diagnosis: Yes. Discussing patient identified problems/goals with staff: Yes. Medical problems stabilized or resolved: Yes. Denies suicidal/homicidal ideation: Yes. Issues/concerns per patient self-inventory: No.  New problem(s) identified: No, Describe:  none  New Short Term/Long Term Goal(s): medication management for mood stabilization; elimination of SI thoughts; development of comprehensive mental wellness/sobriety plan.  Patient Goals:  "To get as much help as I can, less stress, less suicidal thoughts."  Discharge Plan or Barriers: MHAG pamphlet, Mobile Crisis information, and AA/NA information provided to patient for additional community support and  resources. [facility]  Reason for Continuation of Hospitalization: Anxiety Depression  Estimated Length of Stay: discharge today  Attendees: Patient: Wanda Huynh 07/13/2018 8:58 AM  Physician: Javier Docker 07/13/2018 8:58 AM  Nursing: Arlyss Repress, RN 07/13/2018 8:58 AM  RN Care Manager: 07/13/2018 8:58 AM  Social Worker: Enid Cutter, LCSWA 07/13/2018 8:58 AM  Recreational Therapist:  07/13/2018 8:58 AM  Other:  07/13/2018 8:58 AM  Other:  07/13/2018 8:58 AM  Other: 07/13/2018 8:58 AM    Scribe for Treatment Team: Darreld Mclean, LCSWA 07/13/2018 8:58 AM

## 2018-07-13 NOTE — Progress Notes (Signed)
Discharge Note:  Patient discharged home with friend.  Denied SI and HI.  Denied A/V hallucinations.  Suicide prevention information given and discussed with patient who stated she understood and had no questions.  My3 app information given to patient at discharge. Also survey information given to patient.  Patient stated she received all her belongings, clothing, toiletries, misc items.  Patient thanked nurse at discharge.  All required discharge information given to patient at discharge.  

## 2018-07-13 NOTE — [facility] Suicide Risk Assessment (Signed)
[facility]   Principal Problem: Depression Discharge Diagnoses: Active Problems:   MDD (major depressive disorder), severe (HCC)   Total Time spent with patient: 30 minutes  Musculoskeletal: Strength & Muscle Tone: within normal limits Gait & Station: normal Patient leans: N/A  Psychiatric Specialty Exam: ROS denies headache, no chest pain, no shortness of breath, no vomiting, no fever, no chills  Blood pressure (!) 141/90, pulse 66, temperature 98 F (36.7 C), temperature source Oral, resp. rate 18, height 5\' 7"  (1.702 m), weight 71.2 kg, SpO2 100 %.Body mass index is 24.59 kg/m.  General Appearance: Well Groomed  Eye Contact::  Good  Speech:  Normal Rate409  Volume:  Normal  Mood:  Reports improving mood, states "I feel a lot better" today presents euthymic  Affect:  Appropriate and Full in range  Thought Process:  Linear and Descriptions of Associations: Intact  Orientation:  Full (Time, Place, and Person)  Thought Content:  No hallucinations, no delusions  Suicidal Thoughts:  No denies any suicidal or self-injurious ideations, denies homicidal or violent ideations  Homicidal Thoughts:  No  Memory:  Recent and remote grossly intact  Judgement:  Other:  Improving  Insight:  Improving  Psychomotor Activity:  Normal  Concentration:  Good  Recall:  Good  Fund of Knowledge:Good  Language: Good  Akathisia:  Negative  Handed:  Right  AIMS (if indicated):     Assets:  Communication Skills Desire for Improvement Physical Health Resilience  Sleep:  Number of Hours: 6.75  Cognition: WNL  ADL's:  Intact   Mental Status Per Nursing Assessment::   On Admission:  Suicidal ideation indicated by patient, Self-harm thoughts  Demographic Factors:  23 year old single female, no children, college student, lives off campus with roommates, employed  Loss Factors: Relationship stressors, particularly with her father, cannabis abuse  Historical  Factors: No prior psychiatric admissions, denies prior history of suicide attempts, history of prior depression.  History of cannabis use disorder.  Risk Reduction Factors:   Sense of responsibility to family, Living with another person, especially a relative and Positive social support  Continued Clinical Symptoms:  At this time patient is alert, attentive, well related, mood is improved and currently denies depression, presents euthymic, affect appropriate and reactive, no thought disorder, denies suicidal or self-injurious ideations, no homicidal or violent ideations, no hallucinations, no delusions, future oriented.  Denies medication side effects at this time-side effects reviewed including potential risk of increased suicidal ideations early during treatment with antidepressants and young adults.  Also aware of potential sedating properties of Remeron, precautions not to drive if sedated. Patient visible on unit, interacting appropriately with peers, pleasant on approach  Cognitive Features That Contribute To Risk:  No gross cognitive deficits noted upon discharge. Is alert , attentive, and oriented x 3     Suicide Risk:  Mild:  Suicidal ideation of limited frequency, intensity, duration, and specificity.  There are no identifiable plans, no associated intent, mild dysphoria and related symptoms, good self-control (both objective and subjective assessment), few other risk factors, and identifiable protective factors, including available and accessible social support.  Follow-up Information    [facility]/2018.   Why:  Your therapy appointment is Wednesday, 1/15 with Darl PikesSusan at 1:00p. Your medication managment is Thursday, 2/6 at 11:00a.  Contact information: [address] [place], [place] [address] Phone: [phone] Fax: [phone]          Plan Of Care/Follow-up recommendations:  Activity:  As tolerated  Diet:  Regular Tests:  NA Other:  See below   Patient is expressing readiness for discharge and is leaving unit in good spirits, plans to return home [place] up as above.  Craige Cotta, MD 07/13/2018, 1:00 PM

## 2020-01-15 DIAGNOSIS — Z20822 Contact with and (suspected) exposure to covid-19: Secondary | ICD-10-CM | POA: Diagnosis not present

## 2020-01-20 DIAGNOSIS — J209 Acute bronchitis, unspecified: Secondary | ICD-10-CM | POA: Diagnosis not present

## 2020-02-02 DIAGNOSIS — F419 Anxiety disorder, unspecified: Secondary | ICD-10-CM | POA: Diagnosis not present

## 2020-03-15 DIAGNOSIS — Z6825 Body mass index (BMI) 25.0-25.9, adult: Secondary | ICD-10-CM | POA: Diagnosis not present

## 2020-03-15 DIAGNOSIS — E282 Polycystic ovarian syndrome: Secondary | ICD-10-CM | POA: Diagnosis not present

## 2020-03-15 DIAGNOSIS — M9905 Segmental and somatic dysfunction of pelvic region: Secondary | ICD-10-CM | POA: Diagnosis not present

## 2020-03-15 DIAGNOSIS — Z01419 Encounter for gynecological examination (general) (routine) without abnormal findings: Secondary | ICD-10-CM | POA: Diagnosis not present

## 2020-03-16 DIAGNOSIS — Z01419 Encounter for gynecological examination (general) (routine) without abnormal findings: Secondary | ICD-10-CM | POA: Diagnosis not present

## 2020-03-31 DIAGNOSIS — R112 Nausea with vomiting, unspecified: Secondary | ICD-10-CM | POA: Diagnosis not present

## 2020-05-10 ENCOUNTER — Emergency Department (HOSPITAL_COMMUNITY)
Admission: EM | Admit: 2020-05-10 | Discharge: 2020-05-10 | Disposition: A | Discharge status: Home / Self Care | Payer: BC Managed Care – PPO | Attending: Emergency Medicine | Admitting: Emergency Medicine

## 2020-05-10 ENCOUNTER — Encounter (HOSPITAL_COMMUNITY): Payer: Self-pay

## 2020-05-10 DIAGNOSIS — R112 Nausea with vomiting, unspecified: Secondary | ICD-10-CM | POA: Diagnosis not present

## 2020-05-10 DIAGNOSIS — K920 Hematemesis: Secondary | ICD-10-CM

## 2020-05-10 DIAGNOSIS — R1013 Epigastric pain: Secondary | ICD-10-CM | POA: Diagnosis not present

## 2020-05-10 LAB — COMPREHENSIVE METABOLIC PANEL
ALT: 25 U/L (ref 0–44)
AST: 25 U/L (ref 15–41)
Albumin: 4.3 g/dL (ref 3.5–5.0)
Alkaline Phosphatase: 47 U/L (ref 38–126)
Anion gap: 13 (ref 5–15)
BUN: 15 mg/dL (ref 6–20)
CO2: 22 mmol/L (ref 22–32)
Calcium: 9.4 mg/dL (ref 8.9–10.3)
Chloride: 104 mmol/L (ref 98–111)
Creatinine, Ser: 0.79 mg/dL (ref 0.44–1.00)
GFR, Estimated: >60 mL/min (ref >60)
Glucose, Bld: 100 mg/dL — ABNORMAL HIGH (ref 70–99)
Potassium: 3.7 mmol/L (ref 3.5–5.1)
Sodium: 139 mmol/L (ref 135–145)
Total Bilirubin: 1 mg/dL (ref 0.3–1.2)
Total Protein: 7.3 g/dL (ref 6.5–8.1)

## 2020-05-10 LAB — URINALYSIS, ROUTINE W REFLEX MICROSCOPIC
Bilirubin Urine: NEGATIVE
Glucose, UA: NEGATIVE mg/dL
Ketones, ur: NEGATIVE mg/dL
Leukocytes,Ua: NEGATIVE
Nitrite: NEGATIVE
Protein, ur: NEGATIVE mg/dL
Specific Gravity, Urine: 1.024 (ref 1.005–1.030)
pH: 5 (ref 5.0–8.0)

## 2020-05-10 LAB — CBC
HCT: 45.7 % (ref 36.0–46.0)
Hemoglobin: 15.3 g/dL — ABNORMAL HIGH (ref 12.0–15.0)
MCH: 33.7 pg (ref 26.0–34.0)
MCHC: 33.5 g/dL (ref 30.0–36.0)
MCV: 100.7 fL — ABNORMAL HIGH (ref 80.0–100.0)
Platelets: 327 10*3/uL (ref 150–400)
RBC: 4.54 MIL/uL (ref 3.87–5.11)
RDW: 11.5 % (ref 11.5–15.5)
WBC: 8.4 10*3/uL (ref 4.0–10.5)
nRBC: 0 % (ref 0.0–0.2)

## 2020-05-10 LAB — I-STAT BETA HCG BLOOD, ED (MC, WL, AP ONLY): I-stat hCG, quantitative: <5 m[IU]/mL (ref <5)

## 2020-05-10 LAB — LIPASE, BLOOD: Lipase: 88 U/L — ABNORMAL HIGH (ref 11–51)

## 2020-05-10 MED ORDER — SODIUM CHLORIDE 0.9 % IV BOLUS
1000.0000 mL | Freq: Once | INTRAVENOUS | Status: AC
  Administered 2020-05-10: 1000 mL via INTRAVENOUS

## 2020-05-10 MED ORDER — ONDANSETRON HCL 4 MG/2ML IJ SOLN
4.0000 mg | Freq: Once | INTRAMUSCULAR | Status: AC
  Administered 2020-05-10: 4 mg via INTRAVENOUS
  Filled 2020-05-10: qty 2

## 2020-05-10 MED ORDER — PANTOPRAZOLE SODIUM 20 MG PO TBEC: 20.0000 mg | DELAYED_RELEASE_TABLET | Freq: Every day | ORAL | 0 refills | Status: DC

## 2020-05-10 NOTE — ED Notes (Signed)
Patient noted to be relaxed and talking on phone, saline bolus almost complete. She reports she had a wave of nausea just prior to me coming in that passed. Provider messaged.

## 2020-05-10 NOTE — ED Notes (Signed)
Patient care assumed at this time. Patient on all monitors and PA at bedside assessing patient. Patient is alert, oriented and reports no pain but is + for nausea. Abd non tender. Mouth and eyes are moist and well appearing. No distress noted.

## 2020-05-10 NOTE — ED Triage Notes (Signed)
Pt arrives to ED with c/o of vomiting blood this morning multiple times; pt states hx of same and seen at UC then; pt arrives a&4; -Monique,RN

## 2020-05-10 NOTE — Discharge Instructions (Signed)
Please keep appointment with the gastroenterologist on Monday as scheduled Pick up medication and take daily to help reduce the acid in your stomach Return to the ED for persistent vomiting, worsening bleeding, vomit that appears to look like ground up coffee beans, black tarry looking stools, blood in your stools, worsening abdominal pain, or any other new/worsening symptoms

## 2020-05-10 NOTE — ED Provider Notes (Signed)
[facility] EMERGENCY DEPARTMENT Provider Note   CSN: [id number] Arrival date & time: 05/10/20  8099     History Chief Complaint  Patient presents with  . Hematemesis    Wanda Huynh is a 24 y.o. female with PMHx anxiety and depression who presents to the ED today with complaint of persistent nausea and blood tinged emesis that began this morning around 3 AM. Pt reports she woke up with nausea and then proceeded to emesis without blood and after several episodes began having blood tinged sputum. Pt reports the blood became more prominent causing her concern. She reports history of episodic nausea and vomiting for the past 4 years however has only had hematemesis one other time. She has an appointment with GI next week however wanted to come to the ED after noticing blood. Pt denies hx of heavy alcohol use or NSAID use. No hx of cigarette smoking. Pt does smoke marijuana daily however reports her issues with N/V began before smoking marijuana and does not think this is the cause of her symptoms. Pt is also having some mild epigastric abdominal pain. No lower abdominal pain. Denies fevers, chills, chest pain, SOB, diarrhea, constipation, melena, BRBPR, urinary sx, pelvic pain, vaginal discharge, or any other associated symptoms.   The history is provided by the patient and medical records.       Past Medical History:  Diagnosis Date  . Anxiety   . Depression     Patient Active Problem List   Diagnosis Date Noted  . MDD (major depressive disorder), severe (HCC) 07/10/2018    History reviewed. No pertinent surgical history.   OB History   No obstetric history on file.     No family history on file.  Social History   Tobacco Use  . Smoking status: Never Smoker  . Smokeless tobacco: Never Used  Substance Use Topics  . Alcohol use: Not Currently  . Drug use: Yes    Types: Marijuana    Home Medications Prior to Admission medications     Medication Sig Start Date End Date Taking? Authorizing Provider  albuterol (PROVENTIL HFA;VENTOLIN HFA) 108 (90 Base) MCG/ACT inhaler Inhale 1 puff into the lungs every 6 (six) hours as needed for wheezing or shortness of breath.    [provider]  escitalopram (LEXAPRO) 10 MG tablet Take 1 tablet (10 mg total) by mouth daily. For mood 07/14/18   Aldean Baker, NP  hydrOXYzine (ATARAX/VISTARIL) 25 MG tablet Take 1 tablet (25 mg total) by mouth 3 (three) times daily as needed for anxiety. 07/13/18   Aldean Baker, NP  ketoconazole (NIZORAL) 2 % shampoo Apply topically at bedtime as needed for irritation. 07/13/18   Aldean Baker, NP  loratadine (CLARITIN) 10 MG tablet Take 10 mg by mouth daily.    [provider]  mirtazapine (REMERON) 7.5 MG tablet Take 1 tablet (7.5 mg total) by mouth at bedtime. For sleep/mood 07/13/18   Aldean Baker, NP  Norgestimate-Ethinyl Estradiol Triphasic (TRI-LO-MARZIA) 0.18/0.215/0.25 MG-25 MCG tab Take 1 tablet by mouth daily.    [provider]  pantoprazole (PROTONIX) 20 MG tablet Take 1 tablet (20 mg total) by mouth daily for 15 days. 05/10/20 05/25/20  Tanda Rockers, PA-C    Allergies    Lactose intolerance (gi)  Review of Systems   Review of Systems  Constitutional: Negative for chills and fever.  Respiratory: Negative for cough and shortness of breath.   Cardiovascular: Negative for chest pain.  Gastrointestinal:  Positive for nausea and vomiting. Negative for abdominal pain, blood in stool, constipation and diarrhea.  Genitourinary: Negative for pelvic pain.  All other systems reviewed and are negative.   Physical Exam Updated Vital Signs BP 127/70   Pulse 63   Temp (!) 97.4 F (36.3 C) (Oral)   Resp 12   LMP 05/05/2020 (Approximate)   SpO2 98%   Physical Exam Vitals and nursing note reviewed.  Constitutional:      Appearance: She is obese. She is not ill-appearing or diaphoretic.  HENT:     Head: Normocephalic  and atraumatic.  Eyes:     Conjunctiva/sclera: Conjunctivae normal.  Cardiovascular:     Rate and Rhythm: Normal rate and regular rhythm.     Pulses: Normal pulses.  Pulmonary:     Effort: Pulmonary effort is normal.     Breath sounds: Normal breath sounds. No wheezing, rhonchi or rales.  Abdominal:     Palpations: Abdomen is soft.     Tenderness: There is abdominal tenderness. There is no right CVA tenderness, left CVA tenderness, guarding or rebound.     Comments: Soft, mild epigastric abdominal TTP, +BS throughout, no r/g/r, neg murphy's, neg mcburney's, no CVA TTP  Musculoskeletal:     Cervical back: Neck supple.  Skin:    General: Skin is warm and dry.  Neurological:     Mental Status: She is alert.     ED Results / Procedures / Treatments   Labs (all labs ordered are listed, but only abnormal results are displayed) Labs Reviewed  LIPASE, BLOOD - Abnormal; Notable for the following components:      Result Value   Lipase 88 (*)    All other components within normal limits  COMPREHENSIVE METABOLIC PANEL - Abnormal; Notable for the following components:   Glucose, Bld 100 (*)    All other components within normal limits  CBC - Abnormal; Notable for the following components:   Hemoglobin 15.3 (*)    MCV 100.7 (*)    All other components within normal limits  URINALYSIS, ROUTINE W REFLEX MICROSCOPIC - Abnormal; Notable for the following components:   Hgb urine dipstick SMALL (*)    Bacteria, UA RARE (*)    All other components within normal limits  I-STAT BETA HCG BLOOD, ED ([facility], [facility], [facility] ONLY)    EKG None  Radiology No results found.  Procedures Procedures (including critical care time)  Medications Ordered in ED Medications  sodium chloride 0.9 % bolus 1,000 mL (0 mLs Intravenous Stopped 05/10/20 0833)  ondansetron (ZOFRAN) injection 4 mg (4 mg Intravenous Given 05/10/20 0725)    ED Course  I have reviewed the triage vital signs and the nursing  notes.  Pertinent labs & imaging results that were available during my care of the patient were reviewed by me and considered in my medical decision making (see chart for details).    MDM Rules/Calculators/A&P                          24 year old female presenting to the ED today with complaint of nausea and blood tinged sputum that began earlier this morning; hematemesis did not begin until multiple episodes of NBNB emesis. History of similar nonintractible nausea and vomiting with GI follow up appointment next week. On arrival to the ED pt is hemodynamically stable. Afebrile, nontachycardic, and nontachypneic. BP 127/70. Pt appears to be in NAD. She has mild epigastric abdominal TTP without rebound  or guarding. Doubt acute abdomen at this time. Not actively vomiting in the room. She shows me a photo on her phone of her hematemesis - mild amount of blood tinged emesis. No coffee ground emesis. Pt denies melena or hematochezia. Denies heavy EtOH use or NSAID use.   Lab work was obtained  Prior to being seen.  CBC without leukocytosis. Hgb stable at 15.3.  CMP without electrolyte abnormalities. LFTs within normal limits Lipase mildly elevated at 88. Pt without history of heavy EtOH use and no LUQ abdominal TTP to suggest pancreatitis today. Suspect elevation s/2 to dehydration.  Beta hCG negative U/A with small hgb on dipstick however 0-5 RBCs per HPF. No other findings to suggest UTI. No flank pain to suggest kidney stone.   Will plan for fluids, zofran, and reassessment after fluid challenge.   After fluids and zofran pt reports no more emesis however still feeling nauseated. She was able to tolerate gingerale without difficulty. Will plan to discharge home at this time. Given epigastric pain and nausea/vomiting symptoms may be related to GERD vs gastritis vs PUD. Will plan to treat with PPI and have pt follow up with GI next week. She is in agreement with plan and stable for discharge home.    This note was prepared using Dragon voice recognition software and may include unintentional dictation errors due to the inherent limitations of voice recognition software.  Final Clinical Impression(s) / ED Diagnoses Final diagnoses:  Hematemesis with nausea    Rx / DC Orders ED Discharge Orders         Ordered    pantoprazole (PROTONIX) 20 MG tablet  Daily        05/10/20 0912           Discharge Instructions     Please keep appointment with the gastroenterologist on Monday as scheduled Pick up medication and take daily to help reduce the acid in your stomach Return to the ED for persistent vomiting, worsening bleeding, vomit that appears to look like ground up coffee beans, black tarry looking stools, blood in your stools, worsening abdominal pain, or any other new/worsening symptoms       Tanda Rockers, PA-C 05/10/20 0913    Tegeler, Canary Brim, MD 05/10/20 [phone]

## 2020-05-10 NOTE — ED Notes (Signed)
Patient finished ginger ale and has no more episodes of nausea. Alert, ambulatory and verbalized understanding of AVS and F/U w GI Monday. She plans to stop smoking marijauna and will continue easy GI diet with increase fluids. No questions. PIV DC intact. Pressure and dressing applied.

## 2020-05-10 NOTE — ED Notes (Signed)
PO challenge initiated with ginger ale. Patient instructed to proceed slowly as tolerated. Verbalized understanding.

## 2020-05-15 DIAGNOSIS — R197 Diarrhea, unspecified: Secondary | ICD-10-CM | POA: Diagnosis not present

## 2020-05-15 DIAGNOSIS — R112 Nausea with vomiting, unspecified: Secondary | ICD-10-CM | POA: Diagnosis not present

## 2020-05-15 DIAGNOSIS — R194 Change in bowel habit: Secondary | ICD-10-CM | POA: Diagnosis not present

## 2020-05-15 DIAGNOSIS — D7589 Other specified diseases of blood and blood-forming organs: Secondary | ICD-10-CM | POA: Diagnosis not present

## 2020-05-22 DIAGNOSIS — R1084 Generalized abdominal pain: Secondary | ICD-10-CM | POA: Diagnosis not present

## 2020-05-22 DIAGNOSIS — R112 Nausea with vomiting, unspecified: Secondary | ICD-10-CM | POA: Diagnosis not present

## 2020-06-09 DIAGNOSIS — K229 Disease of esophagus, unspecified: Secondary | ICD-10-CM | POA: Diagnosis not present

## 2020-06-09 DIAGNOSIS — R112 Nausea with vomiting, unspecified: Secondary | ICD-10-CM | POA: Diagnosis not present

## 2020-06-09 DIAGNOSIS — R110 Nausea: Secondary | ICD-10-CM | POA: Diagnosis not present

## 2020-07-11 DIAGNOSIS — R1031 Right lower quadrant pain: Secondary | ICD-10-CM | POA: Diagnosis not present

## 2020-07-11 DIAGNOSIS — R509 Fever, unspecified: Secondary | ICD-10-CM | POA: Diagnosis not present

## 2020-07-11 DIAGNOSIS — R110 Nausea: Secondary | ICD-10-CM | POA: Diagnosis not present

## 2020-07-11 DIAGNOSIS — R1011 Right upper quadrant pain: Secondary | ICD-10-CM | POA: Diagnosis not present

## 2020-07-13 ENCOUNTER — Other Ambulatory Visit: Payer: Self-pay

## 2020-07-14 DIAGNOSIS — N200 Calculus of kidney: Secondary | ICD-10-CM | POA: Diagnosis not present

## 2020-08-17 DIAGNOSIS — K219 Gastro-esophageal reflux disease without esophagitis: Secondary | ICD-10-CM | POA: Diagnosis not present

## 2020-08-17 DIAGNOSIS — R197 Diarrhea, unspecified: Secondary | ICD-10-CM | POA: Diagnosis not present

## 2020-08-17 DIAGNOSIS — R112 Nausea with vomiting, unspecified: Secondary | ICD-10-CM | POA: Diagnosis not present

## 2020-09-28 DIAGNOSIS — R112 Nausea with vomiting, unspecified: Secondary | ICD-10-CM | POA: Diagnosis not present

## 2020-11-21 DIAGNOSIS — J209 Acute bronchitis, unspecified: Secondary | ICD-10-CM | POA: Diagnosis not present

## 2020-12-18 DIAGNOSIS — J019 Acute sinusitis, unspecified: Secondary | ICD-10-CM | POA: Diagnosis not present

## 2021-01-08 DIAGNOSIS — J029 Acute pharyngitis, unspecified: Secondary | ICD-10-CM | POA: Diagnosis not present

## 2021-01-11 ENCOUNTER — Telehealth: Payer: Self-pay

## 2021-01-11 NOTE — Telephone Encounter (Signed)
Called patient & had to LVM for her to call back

## 2021-01-11 NOTE — Telephone Encounter (Signed)
Caller states she needs to schedule an appt. She would be a new patient.  Telephone: [phone]

## 2021-01-12 NOTE — Telephone Encounter (Signed)
NP appt scheduled 02/19/2021.

## 2021-02-19 ENCOUNTER — Encounter: Payer: Self-pay | Admitting: Family Medicine

## 2021-02-19 ENCOUNTER — Other Ambulatory Visit: Payer: Self-pay

## 2021-02-19 ENCOUNTER — Ambulatory Visit (INDEPENDENT_AMBULATORY_CARE_PROVIDER_SITE_OTHER): Payer: BC Managed Care – PPO | Admitting: Family Medicine

## 2021-02-19 VITALS — BP 104/68 | HR 86 | Temp 98.2°F | Ht 67.5 in | Wt 170.0 lb

## 2021-02-19 DIAGNOSIS — J45909 Unspecified asthma, uncomplicated: Secondary | ICD-10-CM | POA: Insufficient documentation

## 2021-02-19 DIAGNOSIS — Z1159 Encounter for screening for other viral diseases: Secondary | ICD-10-CM | POA: Diagnosis not present

## 2021-02-19 DIAGNOSIS — Z0000 Encounter for general adult medical examination without abnormal findings: Secondary | ICD-10-CM | POA: Diagnosis not present

## 2021-02-19 DIAGNOSIS — Z114 Encounter for screening for human immunodeficiency virus [HIV]: Secondary | ICD-10-CM

## 2021-02-19 DIAGNOSIS — K219 Gastro-esophageal reflux disease without esophagitis: Secondary | ICD-10-CM | POA: Insufficient documentation

## 2021-02-19 MED ORDER — ALBUTEROL SULFATE HFA 108 (90 BASE) MCG/ACT IN AERS: 1.0000 | INHALATION_SPRAY | Freq: Four times a day (QID) | RESPIRATORY_TRACT | 2 refills | Status: AC | PRN

## 2021-02-19 MED ORDER — PANTOPRAZOLE SODIUM 40 MG PO TBEC: 40.0000 mg | DELAYED_RELEASE_TABLET | Freq: Every day | ORAL | 1 refills | Status: DC

## 2021-02-19 MED ORDER — KETOCONAZOLE 2 % EX SHAM: MEDICATED_SHAMPOO | Freq: Every evening | CUTANEOUS | 0 refills | Status: AC | PRN

## 2021-02-19 MED ORDER — HYDROXYZINE HCL 25 MG PO TABS: 25.0000 mg | ORAL_TABLET | Freq: Three times a day (TID) | ORAL | 1 refills | Status: DC | PRN

## 2021-02-19 NOTE — Progress Notes (Signed)
Chief Complaint  Patient presents with   New Patient (Initial Visit)   Annual Exam     Well Woman Wanda Huynh is here for a complete physical.   Her last physical was >1 year ago.  Current diet: in general, a "healthy" diet. Current exercise: dog training. Contraception? Yes Fatigue out of ordinary? No Seatbelt? Yes  Health Maintenance Pap/HPV- Yes Tetanus- Yes HIV screening- No STI screening- No  Past Medical History:  Diagnosis Date   Anxiety    Asthma    Depression    GERD (gastroesophageal reflux disease)      Past Surgical History:  Procedure Laterality Date   double bunion     HYMENECTOMY     PILONIDAL CYST EXCISION      Medications  Current Outpatient Medications on File Prior to Visit  Medication Sig Dispense Refill   loratadine (CLARITIN) 10 MG tablet Take 10 mg by mouth daily.     albuterol (PROVENTIL HFA;VENTOLIN HFA) 108 (90 Base) MCG/ACT inhaler Inhale 1 puff into the lungs every 6 (six) hours as needed for wheezing or shortness of breath.     hydrOXYzine (ATARAX/VISTARIL) 25 MG tablet Take 1 tablet (25 mg total) by mouth 3 (three) times daily as needed for anxiety. (Patient not taking: Reported on 02/19/2021) 60 tablet 0   ketoconazole (NIZORAL) 2 % shampoo Apply topically at bedtime as needed for irritation. (Patient not taking: Reported on 02/19/2021) 120 mL 0   Norgestimate-Ethinyl Estradiol Triphasic (TRI-LO-MARZIA) 0.18/0.215/0.25 MG-25 MCG tab Take 1 tablet by mouth daily.     pantoprazole (PROTONIX) 20 MG tablet Take 1 tablet (20 mg total) by mouth daily for 15 days. 15 tablet 0   Allergies Allergies  Allergen Reactions   Lactose Intolerance (Gi)     diarrhea     Review of Systems: Constitutional:  no unexpected weight changes Eye:  no recent significant change in vision Ear/Nose/Mouth/Throat:  Ears:  no tinnitus or vertigo and no recent change in hearing Nose/Mouth/Throat:  no complaints of nasal congestion, no sore  throat Cardiovascular: no chest pain Respiratory:  no cough and no shortness of breath Gastrointestinal:  no abdominal pain, no change in bowel habits GU:  Female: negative for dysuria or pelvic pain Musculoskeletal/Extremities:  no new pain of the joints Integumentary (Skin/Breast): +flaking of scalp; otherwise no abnormal skin lesions reported Neurologic:  no headaches Endocrine:  denies fatigue Hematologic/Lymphatic:  No areas of easy bleeding  Exam BP 104/68   Pulse 86   Temp 98.2 F (36.8 C) (Oral)   Ht 5' 7.5" (1.715 m)   Wt 170 lb (77.1 kg)   SpO2 98%   BMI 26.23 kg/m  General:  well developed, well nourished, in no apparent distress Skin:  no significant moles, warts, or growths Head:  no masses, lesions, or tenderness Eyes:  pupils equal and round, sclera anicteric without injection Ears:  canals without lesions, TMs shiny without retraction, no obvious effusion, no erythema Nose:  nares patent, septum midline, mucosa normal, and no drainage or sinus tenderness Throat/Pharynx:  lips and gingiva without lesion; tongue and uvula midline; non-inflamed pharynx; no exudates or postnasal drainage Neck: neck supple without adenopathy, thyromegaly, or masses Lungs:  clear to auscultation, breath sounds equal bilaterally, no respiratory distress Cardio:  regular rate and rhythm, no bruits, no LE edema Abdomen:  abdomen soft, nontender; bowel sounds normal; no masses or organomegaly Genital: Defer to GYN Musculoskeletal:  symmetrical muscle groups noted without atrophy or deformity Extremities:  no clubbing, cyanosis,  or edema, no deformities, no skin discoloration Neuro:  gait normal; deep tendon reflexes normal and symmetric Psych: well oriented with normal range of affect and appropriate judgment/insight  Assessment and Plan  Well adult exam - Plan: Comprehensive metabolic panel, CBC, Lipid panel  Encounter for hepatitis C screening test for low risk patient - Plan:  Hepatitis C antibody  Screening for HIV without presence of risk factors - Plan: HIV Antibody (routine testing w rflx)   Well 25 y.o. female. Counseled on diet and exercise. Other orders as above. Flu shot rec'd for Oct.  Follow up in 6 mo. The patient voiced understanding and agreement to the plan.  Wanda Roche Gardner, DO 02/19/21 3:34 PM

## 2021-02-19 NOTE — Patient Instructions (Addendum)
Give us 2-3 business days to get the results of your labs back.   Keep the diet clean and stay active.  I recommend getting the flu shot in mid October. This suggestion would change if the CDC comes out with a different recommendation.   Let us know if you need anything.  Knee Exercises It is normal to feel mild stretching, pulling, tightness, or discomfort as you do these exercises, but you should stop right away if you feel sudden pain or your pain gets worse.  STRETCHING AND RANGE OF MOTION EXERCISES  These exercises warm up your muscles and joints and improve the movement and flexibility of your knee. These exercises also help to relieve pain, numbness, and tingling. Exercise A: Knee Extension, Prone  Lie on your abdomen on a bed. Place your left / right knee just beyond the edge of the surface so your knee is not on the bed. You can put a towel under your left / right thigh just above your knee for comfort. Relax your leg muscles and allow gravity to straighten your knee. You should feel a stretch behind your left / right knee. Hold this position for 30 seconds. Scoot up so your knee is supported between repetitions. Repeat 2 times. Complete this stretch 3 times per week. Exercise B: Knee Flexion, Active     Lie on your back with both knees straight. If this causes back discomfort, bend your left / right knee so your foot is flat on the floor. Slowly slide your left / right heel back toward your buttocks until you feel a gentle stretch in the front of your knee or thigh. Hold this position for 30 seconds. Slowly slide your left / right heel back to the starting position. Repeat 2 times. Complete this exercise 3 times per week. Exercise C: Quadriceps, Prone     Lie on your abdomen on a firm surface, such as a bed or padded floor. Bend your left / right knee and hold your ankle. If you cannot reach your ankle or pant leg, loop a belt around your foot and grab the belt  instead. Gently pull your heel toward your buttocks. Your knee should not slide out to the side. You should feel a stretch in the front of your thigh and knee. Hold this position for 30 seconds. Repeat 2 times. Complete this stretch 3 times per week. Exercise D: Hamstring, Supine  Lie on your back. Loop a belt or towel over the ball of your left / right foot. The ball of your foot is on the walking surface, right under your toes. Straighten your left / right knee and slowly pull on the belt to raise your leg until you feel a gentle stretch behind your knee. Do not let your left / right knee bend while you do this. Keep your other leg flat on the floor. Hold this position for 30 seconds. Repeat 2 times. Complete this stretch 3 times per week. STRENGTHENING EXERCISES  These exercises build strength and endurance in your knee. Endurance is the ability to use your muscles for a long time, even after they get tired. Exercise E: Quadriceps, Isometric     Lie on your back with your left / right leg extended and your other knee bent. Put a rolled towel or small pillow under your knee if told by your health care provider. Slowly tense the muscles in the front of your left / right thigh. You should see your kneecap slide up toward   your hip or see increased dimpling just above the knee. This motion will push the back of the knee toward the floor. For 3 seconds, keep the muscle as tight as you can without increasing your pain. Relax the muscles slowly and completely. Repeat for 10 total reps Repeat 2 ti mes. Complete this exercise 3 times per week. Exercise F: Straight Leg Raises - Quadriceps  Lie on your back with your left / right leg extended and your other knee bent. Tense the muscles in the front of your left / right thigh. You should see your kneecap slide up or see increased dimpling just above the knee. Your thigh may even shake a bit. Keep these muscles tight as you raise your leg 4-6 inches  (10-15 cm) off the floor. Do not let your knee bend. Hold this position for 3 seconds. Keep these muscles tense as you lower your leg. Relax your muscles slowly and completely after each repetition. 10 total reps. Repeat 2 times. Complete this exercise 3 times per week.  Exercise G: Hamstring Curls     If told by your health care provider, do this exercise while wearing ankle weights. Begin with 5 lb weights (optional). Then increase the weight by 1 lb (0.5 kg) increments. Do not wear ankle weights that are more than 20 lbs to start with. Lie on your abdomen with your legs straight. Bend your left / right knee as far as you can without feeling pain. Keep your hips flat against the floor. Hold this position for 3 seconds. Slowly lower your leg to the starting position. Repeat for 10 reps.  Repeat 2 times. Complete this exercise 3 times per week. Exercise H: Squats (Quadriceps)  Stand in front of a table, with your feet and knees pointing straight ahead. You may rest your hands on the table for balance but not for support. Slowly bend your knees and lower your hips like you are going to sit in a chair. Keep your weight over your heels, not over your toes. Keep your lower legs upright so they are parallel with the table legs. Do not let your hips go lower than your knees. Do not bend lower than told by your health care provider. If your knee pain increases, do not bend as low. Hold the squat position for 1 second. Slowly push with your legs to return to standing. Do not use your hands to pull yourself to standing. Repeat 2 times. Complete this exercise 3 times per week. Exercise I: Wall Slides (Quadriceps)     Lean your back against a smooth wall or door while you walk your feet out 18-24 inches (46-61 cm) from it. Place your feet hip-width apart. Slowly slide down the wall or door until your knees Repeat 2 times. Complete this exercise every other day. Exercise K: Straight Leg Raises -  Hip Abductors  Lie on your side with your left / right leg in the top position. Lie so your head, shoulder, knee, and hip line up. You may bend your bottom knee to help you keep your balance. Roll your hips slightly forward so your hips are stacked directly over each other and your left / right knee is facing forward. Leading with your heel, lift your top leg 4-6 inches (10-15 cm). You should feel the muscles in your outer hip lifting. Do not let your foot drift forward. Do not let your knee roll toward the ceiling. Hold this position for 3 seconds. Slowly return your leg to   the starting position. Let your muscles relax completely after each repetition. 10 total reps. Repeat 2 times. Complete this exercise 3 times per week. Exercise J: Straight Leg Raises - Hip Extensors  Lie on your abdomen on a firm surface. You can put a pillow under your hips if that is more comfortable. Tense the muscles in your buttocks and lift your left / right leg about 4-6 inches (10-15 cm). Keep your knee straight as you lift your leg. Hold this position for 3 seconds. Slowly lower your leg to the starting position. Let your leg relax completely after each repetition. Repeat 2 times. Complete this exercise 3 times per week. Document Released: 05/01/2005 Document Revised: 03/11/2016 Document Reviewed: 04/23/2015 Elsevier Interactive Patient Education  2017 [employer] 

## 2021-02-20 ENCOUNTER — Other Ambulatory Visit: Payer: Self-pay | Admitting: Family Medicine

## 2021-02-20 DIAGNOSIS — R7989 Other specified abnormal findings of blood chemistry: Secondary | ICD-10-CM

## 2021-02-20 DIAGNOSIS — E538 Deficiency of other specified B group vitamins: Secondary | ICD-10-CM

## 2021-02-20 LAB — CBC
HCT: 43.4 % (ref 36.0–46.0)
Hemoglobin: 14.6 g/dL (ref 12.0–15.0)
MCHC: 33.6 g/dL (ref 30.0–36.0)
MCV: 100.4 fl — ABNORMAL HIGH (ref 78.0–100.0)
Platelets: 310 10*3/uL (ref 150.0–400.0)
RBC: 4.32 Mil/uL (ref 3.87–5.11)
RDW: 12.4 % (ref 11.5–15.5)
WBC: 10.6 10*3/uL — ABNORMAL HIGH (ref 4.0–10.5)

## 2021-02-20 LAB — COMPREHENSIVE METABOLIC PANEL
ALT: 38 U/L — ABNORMAL HIGH (ref 0–35)
AST: 25 U/L (ref 0–37)
Albumin: 4.9 g/dL (ref 3.5–5.2)
Alkaline Phosphatase: 57 U/L (ref 39–117)
BUN: 10 mg/dL (ref 6–23)
CO2: 27 mEq/L (ref 19–32)
Calcium: 10.1 mg/dL (ref 8.4–10.5)
Chloride: 101 mEq/L (ref 96–112)
Creatinine, Ser: 0.8 mg/dL (ref 0.40–1.20)
GFR: 104.14 mL/min (ref >60.00)
Glucose, Bld: 70 mg/dL (ref 70–99)
Potassium: 3.7 mEq/L (ref 3.5–5.1)
Sodium: 139 mEq/L (ref 135–145)
Total Bilirubin: 0.6 mg/dL (ref 0.2–1.2)
Total Protein: 7.8 g/dL (ref 6.0–8.3)

## 2021-02-20 LAB — LIPID PANEL
Cholesterol: 189 mg/dL (ref 0–200)
HDL: 58.1 mg/dL (ref >39.00)
LDL Cholesterol: 117 mg/dL — ABNORMAL HIGH (ref 0–99)
NonHDL: 130.45
Total CHOL/HDL Ratio: 3
Triglycerides: 65 mg/dL (ref 0.0–149.0)
VLDL: 13 mg/dL (ref 0.0–40.0)

## 2021-02-20 LAB — HIV ANTIBODY (ROUTINE TESTING W REFLEX): HIV 1&2 Ab, 4th Generation: NONREACTIVE

## 2021-02-20 LAB — HEPATITIS C ANTIBODY
Hepatitis C Ab: NONREACTIVE
SIGNAL TO CUT-OFF: 0.03 (ref <1.00)

## 2021-03-13 ENCOUNTER — Other Ambulatory Visit: Payer: BC Managed Care – PPO

## 2021-03-20 ENCOUNTER — Ambulatory Visit: Payer: BC Managed Care – PPO | Admitting: Family Medicine

## 2021-03-20 ENCOUNTER — Encounter: Payer: Self-pay | Admitting: Family Medicine

## 2021-03-20 ENCOUNTER — Telehealth: Payer: Self-pay

## 2021-03-20 ENCOUNTER — Other Ambulatory Visit: Payer: Self-pay

## 2021-03-20 VITALS — BP 102/62 | HR 108 | Temp 98.4°F | Ht 67.5 in | Wt 163.1 lb

## 2021-03-20 DIAGNOSIS — F411 Generalized anxiety disorder: Secondary | ICD-10-CM

## 2021-03-20 MED ORDER — CITALOPRAM HYDROBROMIDE 20 MG PO TABS: 20.0000 mg | ORAL_TABLET | Freq: Every day | ORAL | 3 refills | Status: DC

## 2021-03-20 MED ORDER — CLONAZEPAM 1 MG PO TABS: 0.5000 mg | ORAL_TABLET | Freq: Two times a day (BID) | ORAL | 1 refills | Status: AC | PRN

## 2021-03-20 NOTE — Patient Instructions (Addendum)
Please consider counseling. Contact [phone] to schedule an appointment or inquire about cost/insurance coverage.  Aim to do some physical exertion for 150 minutes per week. This is typically divided into 5 days per week, 30 minutes per day. The activity should be enough to get your heart rate up. Anything is better than nothing if you have time constraints.  Take 1/2 tab of the Celexa daily for 2 weeks and then take 1 tab daily.   Coping skills Choose 5 that work for you: Take a deep breath Count to 20 Read a book Do a puzzle Meditate Bake Sing Knit Garden Pray Go outside Call a friend Listen to music Take a walk Color Send a note Take a bath Watch a movie Be alone in a quiet place Pet an animal Visit a friend Journal Exercise Stretch

## 2021-03-20 NOTE — Progress Notes (Signed)
Chief Complaint  Patient presents with   Insomnia    Stress    Subjective: Patient is a 25 y.o. female here for stress.  +hx of GAD and depression. Over the past 3 weeks, she has had more stress from work which is affecting her sleep. She has trouble falling asleep and sometimes will wake up and have trouble with waking up. She has been getting 4 hrs nightly which is not sufficient for her. She has a hx of anxiety and depression, but feels her mood is sound. She is not currently following with a counselor. No SI or HI. No self medication. Tried hydroxyzine that did not help. Has also been on Lexapro, Remeron, trazodone.   Past Medical History:  Diagnosis Date   Anxiety    Asthma    Depression    GERD (gastroesophageal reflux disease)     Objective: BP 102/62   Pulse (!) 108   Temp 98.4 F (36.9 C) (Oral)   Ht 5' 7.5" (1.715 m)   Wt 163 lb 2 oz (74 kg)   SpO2 98%   BMI 25.17 kg/m  General: Awake, appears stated age Lungs: No accessory muscle use Psych: Age appropriate judgment and insight, normal affect and mood  Assessment and Plan: GAD (generalized anxiety disorder) - Plan: citalopram (CELEXA) 20 MG tablet, clonazePAM (KLONOPIN) 1 MG tablet  Chronic, uncontrolled. GAD likely underlying cause. Trial Klonopin. She is on OCP, will let me know if plans change for kids. Start 10 mg/d Celexa for 2 weeks and then take 20 mg/d. [facility] info provided. F/u in 6 weeks to reck.  The patient voiced understanding and agreement to the plan.  Jilda Roche [place], DO 03/20/21  1:22 PM

## 2021-03-20 NOTE — Telephone Encounter (Signed)
Nurse Assessment Nurse: Mayford Knife, RN, Lupe Carney Date/Time Wanda Huynh Time): 03/20/2021 8:15:12 AM Confirm and document reason for call. If symptomatic, describe symptoms. ---Caller asks to be seen today either virtual or in person for sxs - insomnia. Has been unable to sleep for several weeks - taking Melatonin with no effect. Impacting her at work significantly. Fatigued. Does the patient have any new or worsening symptoms? ---Yes Will a triage be completed? ---Yes Related visit to physician within the last 2 weeks? ---No Does the PT have any chronic conditions? (i.e. diabetes, asthma, this includes High risk factors for pregnancy, etc.) ---No Is the patient pregnant or possibly pregnant? (Ask all females between the ages of 63-55) ---No Is this a behavioral health or substance abuse call? ---No Guidelines Guideline Title Affirmed Question Affirmed Notes Nurse Date/Time (Eastern Time) Insomnia Insomnia interferes with work or school Mayford Knife, Charity fundraiser, Rebekah 03/20/2021 8:15:38 AM Disp. Time Wanda Huynh Time) Disposition Final User 03/20/2021 8:19:17 AM SEE PCP WITHIN 3 DAYS Yes Turner, RN, Rebekah PLEASE NOTE: All timestamps contained within this report are represented as [place] Standard Time. CONFIDENTIALTY NOTICE: This fax transmission is intended only for the addressee. It contains information that is legally privileged, confidential or otherwise protected from use or disclosure. If you are not the intended recipient, you are strictly prohibited from reviewing, disclosing, copying using or disseminating any of this information or taking any action in reliance on or regarding this information. If you have received this fax in error, please notify us immediately by telephone so that we can arrange for its return to [place]. Phone: [phone], Toll-Free: [phone], Fax: [phone] Page: 2 of 2 Call Id: [id number] Caller Disagree/Comply Comply Caller Understands Yes PreDisposition Call  Doctor Care Advice Given Per Guideline SEE PCP WITHIN 3 DAYS: * You need to be seen within 2 or 3 days. * PCP VISIT: Call your doctor (or NP/PA) during regular office hours and make an appointment. A clinic or urgent care center are good places to go for care if your doctor's office is closed or you can't get an appointment. NOTE: If office will be open tomorrow, tell caller to call then, not in 3 days. CALL BACK IF: * You become worse CARE ADVICE given per Insomnia (Adult) guideline  Appt scheduled today w/ PCP

## 2021-04-04 DIAGNOSIS — R1031 Right lower quadrant pain: Secondary | ICD-10-CM | POA: Diagnosis not present

## 2021-04-04 DIAGNOSIS — Z6826 Body mass index (BMI) 26.0-26.9, adult: Secondary | ICD-10-CM | POA: Diagnosis not present

## 2021-04-04 DIAGNOSIS — Z01419 Encounter for gynecological examination (general) (routine) without abnormal findings: Secondary | ICD-10-CM | POA: Diagnosis not present

## 2021-04-23 DIAGNOSIS — N941 Unspecified dyspareunia: Secondary | ICD-10-CM | POA: Diagnosis not present

## 2021-04-23 DIAGNOSIS — R1031 Right lower quadrant pain: Secondary | ICD-10-CM | POA: Diagnosis not present

## 2021-04-27 ENCOUNTER — Emergency Department (HOSPITAL_COMMUNITY)
Admission: EM | Admit: 2021-04-27 | Discharge: 2021-04-27 | Disposition: A | Discharge status: Home / Self Care | Payer: BC Managed Care – PPO | Attending: Emergency Medicine | Admitting: Emergency Medicine

## 2021-04-27 DIAGNOSIS — Z5321 Procedure and treatment not carried out due to patient leaving prior to being seen by health care provider: Secondary | ICD-10-CM | POA: Insufficient documentation

## 2021-04-27 DIAGNOSIS — R9431 Abnormal electrocardiogram [ECG] [EKG]: Secondary | ICD-10-CM | POA: Diagnosis not present

## 2021-04-27 DIAGNOSIS — R1110 Vomiting, unspecified: Secondary | ICD-10-CM | POA: Diagnosis not present

## 2021-04-27 DIAGNOSIS — R55 Syncope and collapse: Secondary | ICD-10-CM | POA: Diagnosis not present

## 2021-04-27 NOTE — ED Notes (Signed)
Pt sts she does not want to wait. Sts she is going home and she feels worse she will come back

## 2021-05-01 ENCOUNTER — Telehealth (INDEPENDENT_AMBULATORY_CARE_PROVIDER_SITE_OTHER): Payer: BC Managed Care – PPO | Admitting: Family Medicine

## 2021-05-01 ENCOUNTER — Encounter: Payer: Self-pay | Admitting: Family Medicine

## 2021-05-01 DIAGNOSIS — F411 Generalized anxiety disorder: Secondary | ICD-10-CM

## 2021-05-01 DIAGNOSIS — F339 Major depressive disorder, recurrent, unspecified: Secondary | ICD-10-CM | POA: Diagnosis not present

## 2021-05-01 MED ORDER — BUPROPION HCL ER (XL) 150 MG PO TB24
150.0000 mg | ORAL_TABLET | Freq: Every day | ORAL | 2 refills | Status: DC
Start: 2021-05-01 — End: 2021-05-08

## 2021-05-01 NOTE — Progress Notes (Signed)
Chief Complaint  Patient presents with   Follow-up    6 month    Subjective Wanda Huynh presents for f/u anxiety. Due to COVID-19 pandemic, we are interacting via web portal for an electronic face-to-face visit. I verified patient's ID using 2 identifiers. Patient agreed to proceed with visit via this method. Patient is at home, I am at office. Patient and I are present for visit.   Pt is currently being treated with Celexa 20 mg/d, Klonopin 0.5-1 mg prn, has been using daily.  Reports some improvement since treatment. Life events have caused increased s/s's.  No thoughts of harming others. She was having thoughts of harming herself. She was admitted to [facility] team 3 years ago after an attempt. No access to deadly weapons, intent or plan.  No self-medication with alcohol, prescription drugs or illicit drugs. She is exercising.  Pt is not following with a counselor/psychologist.  Past Medical History:  Diagnosis Date   Anxiety    Asthma    Depression    GERD (gastroesophageal reflux disease)    Allergies as of 05/01/2021       Reactions   Lactose Intolerance (gi)    diarrhea         Medication List        Accurate as of May 01, 2021  9:56 AM. If you have any questions, ask your nurse or doctor.          albuterol 108 (90 Base) MCG/ACT inhaler Commonly known as: VENTOLIN HFA Inhale 1 puff into the lungs every 6 (six) hours as needed for wheezing or shortness of breath.   buPROPion 150 MG 24 hr tablet Commonly known as: Wellbutrin XL Take 1 tablet (150 mg total) by mouth daily. Started by: Sharlene Dory, DO   citalopram 20 MG tablet Commonly known as: CELEXA Take 1 tablet (20 mg total) by mouth daily.   clonazePAM 1 MG tablet Commonly known as: KLONOPIN Take 0.5-1 tablets (0.5-1 mg total) by mouth 2 (two) times daily as needed for anxiety.   ketoconazole 2 % shampoo Commonly known as: NIZORAL Apply topically at bedtime as needed for  irritation.   loratadine 10 MG tablet Commonly known as: CLARITIN Take 10 mg by mouth daily.   Norgestimate-Ethinyl Estradiol Triphasic 0.18/0.215/0.25 MG-25 MCG tab Take 1 tablet by mouth daily.   pantoprazole 40 MG tablet Commonly known as: PROTONIX Take 1 tablet (40 mg total) by mouth daily.   promethazine 25 MG tablet Commonly known as: PHENERGAN Take 1 tablet by mouth every 8 (eight) hours as needed.        Exam No conversational dyspnea Age appropriate judgment and insight Nml affect and mood  Assessment and Plan  GAD (generalized anxiety disorder) - Plan: Ambulatory referral to Psychiatry  Depression, recurrent (HCC) - Plan: Ambulatory referral to Psychiatry, buPROPion (WELLBUTRIN XL) 150 MG 24 hr tablet  Chronic, unstable. Cont Celexa 20 mg/d, Klonopin prn. Add Wellbutrin 150 mg/d. Refer psych. Self referral info provided on MyChart.  F/u in 1 mo. The patient voiced understanding and agreement to the plan.  Jilda Roche [place], DO 05/01/21 9:56 AM

## 2021-05-05 ENCOUNTER — Encounter (HOSPITAL_BASED_OUTPATIENT_CLINIC_OR_DEPARTMENT_OTHER): Payer: Self-pay | Admitting: Emergency Medicine

## 2021-05-05 ENCOUNTER — Emergency Department (HOSPITAL_BASED_OUTPATIENT_CLINIC_OR_DEPARTMENT_OTHER): Payer: BC Managed Care – PPO

## 2021-05-05 ENCOUNTER — Other Ambulatory Visit: Payer: Self-pay

## 2021-05-05 ENCOUNTER — Emergency Department (HOSPITAL_BASED_OUTPATIENT_CLINIC_OR_DEPARTMENT_OTHER)
Admission: EM | Admit: 2021-05-05 | Discharge: 2021-05-05 | Disposition: A | Discharge status: Home / Self Care | Payer: BC Managed Care – PPO | Attending: Emergency Medicine | Admitting: Emergency Medicine

## 2021-05-05 DIAGNOSIS — J45909 Unspecified asthma, uncomplicated: Secondary | ICD-10-CM | POA: Diagnosis not present

## 2021-05-05 DIAGNOSIS — R55 Syncope and collapse: Secondary | ICD-10-CM | POA: Insufficient documentation

## 2021-05-05 DIAGNOSIS — R42 Dizziness and giddiness: Secondary | ICD-10-CM | POA: Insufficient documentation

## 2021-05-05 DIAGNOSIS — Z79899 Other long term (current) drug therapy: Secondary | ICD-10-CM | POA: Insufficient documentation

## 2021-05-05 DIAGNOSIS — R569 Unspecified convulsions: Secondary | ICD-10-CM

## 2021-05-05 DIAGNOSIS — R251 Tremor, unspecified: Secondary | ICD-10-CM | POA: Insufficient documentation

## 2021-05-05 LAB — URINALYSIS, ROUTINE W REFLEX MICROSCOPIC
Bilirubin Urine: NEGATIVE
Glucose, UA: NEGATIVE mg/dL
Hgb urine dipstick: NEGATIVE
Ketones, ur: NEGATIVE mg/dL
Leukocytes,Ua: NEGATIVE
Nitrite: NEGATIVE
Protein, ur: NEGATIVE mg/dL
Specific Gravity, Urine: 1.02 (ref 1.005–1.030)
pH: 7 (ref 5.0–8.0)

## 2021-05-05 LAB — BASIC METABOLIC PANEL
Anion gap: 9 (ref 5–15)
BUN: 14 mg/dL (ref 6–20)
CO2: 24 mmol/L (ref 22–32)
Calcium: 9.5 mg/dL (ref 8.9–10.3)
Chloride: 107 mmol/L (ref 98–111)
Creatinine, Ser: 0.97 mg/dL (ref 0.44–1.00)
GFR, Estimated: >60 mL/min (ref >60)
Glucose, Bld: 97 mg/dL (ref 70–99)
Potassium: 3.7 mmol/L (ref 3.5–5.1)
Sodium: 140 mmol/L (ref 135–145)

## 2021-05-05 LAB — RAPID URINE DRUG SCREEN, HOSP PERFORMED
Amphetamines: NOT DETECTED
Barbiturates: NOT DETECTED
Benzodiazepines: NOT DETECTED
Cocaine: NOT DETECTED
Opiates: NOT DETECTED
Tetrahydrocannabinol: POSITIVE — AB

## 2021-05-05 LAB — CBC
HCT: 43.5 % (ref 36.0–46.0)
Hemoglobin: 14.9 g/dL (ref 12.0–15.0)
MCH: 34 pg (ref 26.0–34.0)
MCHC: 34.3 g/dL (ref 30.0–36.0)
MCV: 99.3 fL (ref 80.0–100.0)
Platelets: 331 10*3/uL (ref 150–400)
RBC: 4.38 MIL/uL (ref 3.87–5.11)
RDW: 12 % (ref 11.5–15.5)
WBC: 10.1 10*3/uL (ref 4.0–10.5)
nRBC: 0 % (ref 0.0–0.2)

## 2021-05-05 LAB — CBG MONITORING, ED: Glucose-Capillary: 88 mg/dL (ref 70–99)

## 2021-05-05 LAB — PREGNANCY, URINE: Preg Test, Ur: NEGATIVE

## 2021-05-05 MED ORDER — SODIUM CHLORIDE 0.9% FLUSH
3.0000 mL | Freq: Once | INTRAVENOUS | Status: AC
  Administered 2021-05-05: 3 mL via INTRAVENOUS
  Filled 2021-05-05: qty 3

## 2021-05-05 MED ORDER — LORAZEPAM 2 MG/ML IJ SOLN
1.0000 mg | Freq: Once | INTRAMUSCULAR | Status: AC
  Administered 2021-05-05: 1 mg via INTRAVENOUS
  Filled 2021-05-05: qty 1

## 2021-05-05 NOTE — ED Notes (Signed)
Called to room by significant other, pt lying partially on left side, demonstrating bilateral leg shaking, rt arm stiffness, with hyperpnea, lasted 72 seconds by observation of client, appeared to relax and have tachypnea then normal breathing to follow. Side rails with rail pads remain in place, remains on cont cardiac monitor with POX and int NBP assessment

## 2021-05-05 NOTE — ED Notes (Addendum)
Pt able to lay still for xray.

## 2021-05-05 NOTE — ED Notes (Signed)
Provider at bedside

## 2021-05-05 NOTE — ED Triage Notes (Signed)
Pt brought in by family with c/o 8 syncopal episodes at work this morning. Pt reports prior to having syncopal episode that she feels her heart racing or slowing down each time.

## 2021-05-05 NOTE — ED Notes (Signed)
ED Provider at bedside. 

## 2021-05-05 NOTE — Discharge Instructions (Addendum)
Dear Ms. Wanda Huynh,  Today we evaluated you for syncopal versus seizure event.  Our work-up has been largely unremarkable at this point.  We got an electrocardiogram as well as a chest x-ray which did not reveal any cardiac causes.  We also obtained a head CT scan which did not show any causes in your brain such as masses or bleeds to explain this.  Your electrolytes have also been within our normal limits.  We discussed her case with a neurologist who recommends an EEG. We gave you the option to either be admitted to the hospital or follow-up as an outpatient, ultimately you decided he would like to pursue further work-up as an outpatient.  We have placed a referral to [facility] neurology to be seen by them.  Their office should contact you to schedule an appointment, however if you do not hear from them in the next couple days we suggest you give them a call.  Their office can be reached at [phone].  In the meantime we strongly recommend against you driving or operating any sort of heavy machinery.

## 2021-05-05 NOTE — ED Notes (Signed)
X-ray at bedside

## 2021-05-05 NOTE — ED Notes (Addendum)
Pt noted to have multiple episodes while this writer was in the room.  Pt noted to have bilateral eyes rolled back and jerking in bilateral legs.  Pt able to have a clear and normal conversation between episodes.  Jaw was not clenched and this writer was able to move bilateral arms freely during episodes.  No incontinence noted.

## 2021-05-05 NOTE — ED Notes (Signed)
Pt's visitors requesting to speak to EDP.  EDP made aware.

## 2021-05-05 NOTE — ED Provider Notes (Signed)
[facility] EMERGENCY DEPARTMENT Provider Note   CSN: [id number] Arrival date & time: 05/05/21  0920     History Chief Complaint  Patient presents with   Loss of Consciousness    Wanda Huynh is a 25 y.o. female. Hx  of depression and anxiety who presents with complaints of multiple syncopal episodes. She reports that while at work, she experienced approximately 10 syncopal episodes over the course of 45 minutes. While in ED waiting room, patient experienced another episode of LOC. She quickly regained consciousness. S/p LOC patient alert and oriented and answering questions appropriately. Denies any nausea, vomiting, no fever, chills, CP, or SOB. She endorses going to the [facility] ED one week ago with complaints of multiple syncopal episodes at that time as well, however, she left before evaluation could be completed because she did not want to wait.  Throughout her stay in the emergency department, her symptoms varied, initially LOC with non-responsive, however, later episodes involved tremors and shaking. In between each syncopal episode and episode of shaking, patient was able to answer questions appropriately. Episodes were short in duration and self limited.   Loss of Consciousness Associated symptoms: no chest pain, no confusion and no palpitations       Past Medical History:  Diagnosis Date   Anxiety    Asthma    Depression    GERD (gastroesophageal reflux disease)     Patient Active Problem List   Diagnosis Date Noted   Depression, recurrent (HCC) 05/01/2021   GAD (generalized anxiety disorder) 05/01/2021   Asthma 02/19/2021   GERD (gastroesophageal reflux disease) 02/19/2021    Past Surgical History:  Procedure Laterality Date   double bunion     HYMENECTOMY     PILONIDAL CYST EXCISION       OB History   No obstetric history on file.     Family History  Problem Relation Age of Onset   Cancer Neg Hx     Social History   Tobacco Use    Smoking status: Never   Smokeless tobacco: Never  Vaping Use   Vaping Use: Never used  Substance Use Topics   Alcohol use: Not Currently   Drug use: Yes    Types: Marijuana    Home Medications Prior to Admission medications   Medication Sig Start Date End Date Taking? Authorizing Provider  albuterol (VENTOLIN HFA) 108 (90 Base) MCG/ACT inhaler Inhale 1 puff into the lungs every 6 (six) hours as needed for wheezing or shortness of breath. 02/19/21   Carmelia Roller, Jilda Roche, DO  buPROPion (WELLBUTRIN XL) 150 MG 24 hr tablet Take 1 tablet (150 mg total) by mouth daily. 05/01/21   Sharlene Dory, DO  citalopram (CELEXA) 20 MG tablet Take 1 tablet (20 mg total) by mouth daily. 03/20/21   Sharlene Dory, DO  clonazePAM (KLONOPIN) 1 MG tablet Take 0.5-1 tablets (0.5-1 mg total) by mouth 2 (two) times daily as needed for anxiety. 03/20/21   Sharlene Dory, DO  ketoconazole (NIZORAL) 2 % shampoo Apply topically at bedtime as needed for irritation. 02/19/21   Sharlene Dory, DO  loratadine (CLARITIN) 10 MG tablet Take 10 mg by mouth daily.    [provider]  Norgestimate-Ethinyl Estradiol Triphasic 0.18/0.215/0.25 MG-25 MCG tab Take 1 tablet by mouth daily.    [provider]  pantoprazole (PROTONIX) 40 MG tablet Take 1 tablet (40 mg total) by mouth daily. 02/19/21   Sharlene Dory, DO  promethazine (PHENERGAN) 25 MG tablet  Take 1 tablet by mouth every 8 (eight) hours as needed. 02/14/21   [provider]    Allergies    Lactose intolerance (gi)  Review of Systems   Review of Systems  Constitutional: Negative.   HENT: Negative.    Eyes: Negative.   Respiratory: Negative.    Cardiovascular:  Positive for syncope. Negative for chest pain, palpitations and leg swelling.  Gastrointestinal: Negative.   Endocrine: Negative.   Genitourinary: Negative.   Musculoskeletal: Negative.   Skin: Negative.   Psychiatric/Behavioral:   Negative for confusion.    Physical Exam Updated Vital Signs BP (!) 144/100   Pulse 69   Temp 98.6 F (37 C) (Oral)   Resp 15   Ht 5\' 7"  (1.702 m)   Wt 73.9 kg   LMP 05/05/2021   SpO2 100%   BMI 25.53 kg/m   Physical Exam Constitutional:      General: She is not in acute distress.    Appearance: Normal appearance. She is normal weight. She is not ill-appearing, toxic-appearing or diaphoretic.  HENT:     Head: Normocephalic and atraumatic.  Eyes:     Extraocular Movements: Extraocular movements intact.     Conjunctiva/sclera: Conjunctivae normal.     Pupils: Pupils are equal, round, and reactive to light.  Cardiovascular:     Rate and Rhythm: Normal rate and regular rhythm.     Pulses: Normal pulses.     Heart sounds: Normal heart sounds.  Pulmonary:     Effort: Pulmonary effort is normal.     Breath sounds: Normal breath sounds.  Abdominal:     General: Abdomen is flat. Bowel sounds are normal.     Palpations: Abdomen is soft.  Musculoskeletal:        General: Normal range of motion.     Cervical back: Normal range of motion and neck supple.  Skin:    General: Skin is warm and dry.  Neurological:     General: No focal deficit present.     Mental Status: She is alert and oriented to person, place, and time. Mental status is at baseline.     Cranial Nerves: No cranial nerve deficit.     Sensory: No sensory deficit.     Motor: No weakness.     Coordination: Coordination normal.     Gait: Gait normal.     Deep Tendon Reflexes: Reflexes normal.  Psychiatric:        Thought Content: Thought content normal.    ED Results / Procedures / Treatments   Labs (all labs ordered are listed, but only abnormal results are displayed) Labs Reviewed  RAPID URINE DRUG SCREEN, HOSP PERFORMED - Abnormal; Notable for the following components:      Result Value   Tetrahydrocannabinol POSITIVE (*)    All other components within normal limits  BASIC METABOLIC PANEL  CBC   URINALYSIS, ROUTINE W REFLEX MICROSCOPIC  PREGNANCY, URINE  CBG MONITORING, ED    EKG EKG Interpretation  Date/Time:  Saturday May 05 2021 09:31:28 EST Ventricular Rate:  72 PR Interval:  116 QRS Duration: 90 QT Interval:  384 QTC Calculation: 420 R Axis:   81 Text Interpretation: Normal sinus rhythm Normal ECG agree, no sig change from previous Confirmed by [id number] [phone]) on 05/06/2021 1:14:09 PM  Radiology CT Head Wo Contrast  Result Date: 05/05/2021 CLINICAL DATA:  Seizure EXAM: CT HEAD WITHOUT CONTRAST TECHNIQUE: Contiguous axial images were obtained from the base of the skull through the vertex  without intravenous contrast. COMPARISON:  None. FINDINGS: Brain: No evidence of acute infarction, hemorrhage, hydrocephalus, extra-axial collection or mass lesion/mass effect. Vascular: Negative for hyperdense vessel Skull: Negative Sinuses/Orbits: Negative Other: None IMPRESSION: Normal CT head Electronically Signed   By: Marlan Palau M.D.   On: 05/05/2021 11:31   DG Chest Portable 1 View  Result Date: 05/05/2021 CLINICAL DATA:  Syncopal episode.  Possible seizure. EXAM: PORTABLE CHEST 1 VIEW COMPARISON:  None. FINDINGS: The heart size and mediastinal contours are within normal limits. Both lungs are clear. No evidence of pneumothorax or pleural effusion. IMPRESSION: No active disease. Electronically Signed   By: Danae Orleans M.D.   On: 05/05/2021 11:45    Procedures Procedures   Medications Ordered in ED Medications  sodium chloride flush (NS) 0.9 % injection 3 mL (3 mLs Intravenous Given by Other 05/05/21 0946)  LORazepam (ATIVAN) injection 1 mg (1 mg Intravenous Given 05/05/21 1027)    ED Course  I have reviewed the triage vital signs and the nursing notes.  Pertinent labs & imaging results that were available during my care of the patient were reviewed by me and considered in my medical decision making (see chart for details).    MDM Rules/Calculators/A&P                            Patient presented with complaints of loss of consciousness approximately 10 times while at work this morning.  She reports that she has had similar episodes approximately 1 week ago but did not want to wait and left before she could be worked up. Work-up was started for syncopal event versus seizure.  Thus far work-up has been unrevealing.  EKG chest x-ray have been negative making cardiac etiology unlikely. Head CT was negative as well ruling out intracranial mass and bleed as etiology. Over the course of the patient's evaluation in the emergency department she has had multiple recurrent episodes.  In between these episodes she is awake alert and oriented and is able to carry on conversations.  Furthermore her symptoms have varied each time ranging from simple loss of consciousness to nonresponsiveness to shaking events.  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  Discussed the case with neurology who felt the patient could either be admitted for EEG versus outpatient referral to neurology. Plan was presented to patient and father who was present at bedside. Recommended admission to expedite work up. Patient and father discussed, however, patient opted for outpatient workup instead. Referral to outpatient neurology was placed. Instructed patient not to drive/operate heavy machinery until she can be cleared by neurology.   Final Clinical Impression(s) / ED Diagnoses Final diagnoses:  None    Rx / DC Orders ED Discharge Orders          Ordered    Ambulatory referral to Neurology       Comments: An appointment is requested in approximately: 1 week  New onset seizures   05/05/21 1430             Adron Bene, MD 05/07/21 1610    Milagros Loll, MD 05/08/21 1345

## 2021-05-05 NOTE — ED Notes (Signed)
Pt able to use bedside commode and get back into bed.  After getting back into bed, Pt had another episode.  Legs noted to be shaking and eyes rolled back, but vital signs remained WDL.  EDP notified.

## 2021-05-08 ENCOUNTER — Telehealth (INDEPENDENT_AMBULATORY_CARE_PROVIDER_SITE_OTHER): Payer: BC Managed Care – PPO | Admitting: Family Medicine

## 2021-05-08 ENCOUNTER — Encounter: Payer: Self-pay | Admitting: Family Medicine

## 2021-05-08 ENCOUNTER — Other Ambulatory Visit: Payer: Self-pay

## 2021-05-08 DIAGNOSIS — F411 Generalized anxiety disorder: Secondary | ICD-10-CM | POA: Diagnosis not present

## 2021-05-08 DIAGNOSIS — R569 Unspecified convulsions: Secondary | ICD-10-CM | POA: Diagnosis not present

## 2021-05-08 DIAGNOSIS — F339 Major depressive disorder, recurrent, unspecified: Secondary | ICD-10-CM | POA: Diagnosis not present

## 2021-05-08 MED ORDER — CITALOPRAM HYDROBROMIDE 40 MG PO TABS: 40.0000 mg | ORAL_TABLET | Freq: Every day | ORAL | 3 refills | Status: AC

## 2021-05-08 NOTE — Progress Notes (Signed)
Chief Complaint  Patient presents with   Follow-up    Documentation for work note.    Subjective: Patient is a 25 y.o. female here for ER f/u. Due to COVID-19 pandemic, we are interacting via web portal for an electronic face-to-face visit. I verified patient's ID using 2 identifiers. Patient agreed to proceed with visit via this method. Patient is at home, I am at office. Patient and I are present for visit.  Patient reports several weeks of intermittent syncopal episodes.  He got worse with increased stress.  She is taking Celexa 20 mg daily and was recently placed on Wellbutrin.  She went to the emergency department had a negative work-up.  She has an appointment with the neurology team in 2 days.  She needs a letter stating that she is currently unable to work given her situation.  She did have episodes of syncope at work as well.  She is eating and drinking normally and denies any signs of infection.  Past Medical History:  Diagnosis Date   Anxiety    Asthma    Depression    GERD (gastroesophageal reflux disease)     Objective: LMP 05/05/2021  No conversational dyspnea Age appropriate judgment and insight Nml affect and mood  Assessment and Plan: Seizure-like activity (HCC)  Depression, recurrent (HCC) - Plan: citalopram (CELEXA) 40 MG tablet  GAD (generalized anxiety disorder) - Plan: citalopram (CELEXA) 40 MG tablet  Patient has an appointment with neurology in 2 days.  Declined any seizure medication at this time given short follow-up and her ability to stay inside until that time.  I will stop her Wellbutrin in the meantime.  Letter written excusing from work at this time. 2/3.  Increase dosage of Celexa to 40 mg daily since we are stopping her Wellbutrin.  If this is actually a functional neurologic deficit, we can add it back on in the future but will wait for the neurology work-up. The patient voiced understanding and agreement to the plan.  Jilda Roche Simla,  DO 05/08/21  7:32 AM

## 2021-05-10 ENCOUNTER — Encounter: Payer: Self-pay | Admitting: Neurology

## 2021-05-10 ENCOUNTER — Ambulatory Visit: Payer: BC Managed Care – PPO | Admitting: Neurology

## 2021-05-10 ENCOUNTER — Ambulatory Visit (INDEPENDENT_AMBULATORY_CARE_PROVIDER_SITE_OTHER): Payer: BC Managed Care – PPO | Admitting: Neurology

## 2021-05-10 VITALS — Ht 67.0 in | Wt 153.0 lb

## 2021-05-10 DIAGNOSIS — R569 Unspecified convulsions: Secondary | ICD-10-CM | POA: Diagnosis not present

## 2021-05-10 NOTE — Patient Instructions (Signed)
Routine EEG  Referral to EMU for capture and characterization  Return in 3 months

## 2021-05-10 NOTE — Progress Notes (Signed)
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 Patient states that activities started about 2 weeks ago, at that time, she was having 10-12 episodes per day. She will feel scared, then pass out, was told that she shaked with the passing out. Sometimes, she will be unconscious, other, she can hear her surrounding. Was also told that she tenses up and twitches. Afterward she will feel tired and lightheaded, with headaches and sensitivity to light. Lately she is having out of body experiences, like she can see herself seize, and also a feeling of floating when she is walking. Currently she is having 4 to 5 episodes per day, reports lots of pain coming out of seizures. She also reports  a previous history of syncope.  She was seen in the ED 11/5 for the same, episodes were witnessed was were suspicious for nonepileptic seizures, she was recommended admission for EMU but left against medical advise. She had followed up with her PMD on 11/8 and her Celexa was increased to 40 mg daily.    Handedness: right handed   Seizure Type: ?unclear, passing out, tensing up and shaking.   Current frequency: 4 to 5 episodes per day.  Any injuries from seizures: No injuries, reports tongue biting   Seizure risk factors: None reported, born at 38 weeks, has to have UV light treatment   Previous ASMs: None   Currenty ASMs: None   ASMs side effects: None   Brain Images: Negative  05/05/2021  Previous EEGs: None    OTHER MEDICAL CONDITIONS: PCOS, Anxiety/Depression, GERD   REVIEW OF SYSTEMS: Full 14 system review of systems performed and negative with exception of: as noted in the HPI  ALLERGIES: Allergies  Allergen Reactions   Lactose Intolerance (Gi)     diarrhea     HOME MEDICATIONS: Outpatient Medications Prior to Visit  Medication Sig Dispense Refill   albuterol (VENTOLIN HFA) 108 (90 Base) MCG/ACT inhaler Inhale 1 puff into the lungs every 6 (six) hours as needed for wheezing or shortness of breath. 18 g 2   citalopram (CELEXA) 40 MG tablet Take 1 tablet (40 mg total) by mouth daily. 30 tablet 3   clonazePAM (KLONOPIN) 1 MG tablet Take 0.5-1 tablets (0.5-1 mg total) by mouth 2 (two) times daily as needed for anxiety. 30 tablet 1   ketoconazole (NIZORAL) 2 % shampoo Apply topically at bedtime as needed for irritation. 120 mL 0   loratadine (CLARITIN) 10 MG tablet Take 10 mg by mouth daily.     Norgestimate-Ethinyl Estradiol Triphasic 0.18/0.215/0.25 MG-25 MCG tab Take 1 tablet by mouth daily.     pantoprazole (PROTONIX) 40 MG tablet Take 1 tablet (40 mg total) by mouth daily. 30 tablet 1   promethazine (PHENERGAN) 25 MG tablet Take 1 tablet by mouth every 8 (eight) hours as needed.     No facility-administered medications prior to visit.    PAST MEDICAL HISTORY: Past Medical History:  Diagnosis Date   Anxiety    Asthma  Depression    GERD (gastroesophageal reflux disease)     PAST SURGICAL HISTORY: Past Surgical History:  Procedure Laterality Date   double bunion     HYMENECTOMY     PILONIDAL CYST EXCISION      FAMILY HISTORY: Family History  Problem Relation Age of Onset   Cancer Neg Hx     SOCIAL HISTORY: Social History   Socioeconomic History   Marital status: Single    Spouse name: Not on file   Number of children: Not on file   Years of education: Not on file   Highest education level: Not on file  Occupational History    Not on file  Tobacco Use   Smoking status: Never   Smokeless tobacco: Never  Vaping Use   Vaping Use: Never used  Substance and Sexual Activity   Alcohol use: Not Currently   Drug use: Yes    Types: Marijuana   Sexual activity: Yes    Partners: Female  Other Topics Concern   Not on file  Social History Narrative   Not on file   Social Determinants of Health   Financial Resource Strain: Not on file  Food Insecurity: Not on file  Transportation Needs: Not on file  Physical Activity: Not on file  Stress: Not on file  Social Connections: Not on file  Intimate Partner Violence: Not on file    PHYSICAL EXAM  GENERAL EXAM/CONSTITUTIONAL: Vitals:  Vitals:   05/10/21 1246  Weight: 153 lb (69.4 kg)  Height: 5\' 7"  (1.702 m)   Body mass index is 23.96 kg/m. Wt Readings from Last 3 Encounters:  05/10/21 153 lb (69.4 kg)  05/05/21 163 lb (73.9 kg)  03/20/21 163 lb 2 oz (74 kg)   Patient is in no distress; well developed, nourished and groomed; neck is supple  CARDIOVASCULAR: Examination of carotid arteries is normal; no carotid bruits Regular rate and rhythm, no murmurs Examination of peripheral vascular system by observation and palpation is normal  EYES: Pupils round and reactive to light, Visual fields full to confrontation, Extraocular movements intacts,  No results found.  MUSCULOSKELETAL: Gait, strength, tone, movements noted in Neurologic exam below  NEUROLOGIC: MENTAL STATUS:  No flowsheet data found. awake, alert, oriented to person, place and time recent and remote memory intact normal attention and concentration language fluent, comprehension intact, naming intact fund of knowledge appropriate  CRANIAL NERVE: 2nd, 3rd, 4th, 6th - pupils equal and reactive to light, visual fields full to confrontation, extraocular muscles intact, no nystagmus 5th - facial sensation symmetric 7th - facial strength symmetric 8th - hearing intact 9th - palate  elevates symmetrically, uvula midline 11th - shoulder shrug symmetric 12th - tongue protrusion midline  MOTOR:  normal bulk and tone, full strength in the BUE, BLE  SENSORY:  normal and symmetric to light touch, pinprick, temperature, vibration  COORDINATION:  finger-nose-finger, fine finger movements normal  REFLEXES:  deep tendon reflexes present and symmetric  GAIT/STATION:  normal   DIAGNOSTIC DATA (LABS, IMAGING, TESTING) - I reviewed patient records, labs, notes, testing and imaging myself where available.  Lab Results  Component Value Date   WBC 10.1 05/05/2021   HGB 15.3 (H) 05/12/2021   HCT 45.0 05/12/2021   MCV 99.3 05/05/2021   PLT 331 05/05/2021      Component Value Date/Time   NA 143 05/12/2021 1549   K 3.8 05/12/2021 1549   CL 107 05/05/2021 0941   CO2 24 05/05/2021 0941   GLUCOSE 97 05/05/2021  0941   BUN 14 05/05/2021 0941   CREATININE 0.97 05/05/2021 0941   CALCIUM 9.5 05/05/2021 0941   PROT 7.8 02/19/2021 1516   ALBUMIN 4.9 02/19/2021 1516   AST 25 02/19/2021 1516   ALT 38 (H) 02/19/2021 1516   ALKPHOS 57 02/19/2021 1516   BILITOT 0.6 02/19/2021 1516   GFRNONAA >60 05/05/2021 0941   GFRAA >60 07/11/2018 0749   Lab Results  Component Value Date   CHOL 189 02/19/2021   HDL 58.10 02/19/2021   LDLCALC 117 (H) 02/19/2021   TRIG 65.0 02/19/2021   No results found for: HGBA1C No results found for: VITAMINB12 Lab Results  Component Value Date   TSH 1.268 07/11/2018    Head CT 05/05/2021: Normal Head CT   I personally reviewed brain Images.   ASSESSMENT AND PLAN  25 y.o. year old female  with depression, anxiety, PCOS who is presenting with seizures like activity described as passing out, tensing up, sometimes twitching. These episodes were initially happening up to 12 times per day, but now 4 to 5 episodes per day. There are also reports of out of body experiences. She is accompanied by her father who has never witnessed these episodes.  No injuries with these episodes. She was seen recently in the ED on 11/5, episodes witnessed by ED staff and suspicious for nonepileptic seizures. She was offered EMU admission but she declined and left AMA. Explained to the patient and father that based on inconsistent reports of events, description of events in the ED, it is likely that not that patient has psychogenic non-epileptic seizure and since she is having daily events, I would like an urgent referral to EMU for capture and characterize the events and if these are in fact nonepileptic, she would need referral to psychiatry to manage them. They understand and agreeable with plans. I would also get a routine EEG today.    1. Seizure-like activity (HCC)    PLAN: Routine EEG  Referral to EMU for capture and characterization  Return in 3 months   Per [facility] statutes, patients with seizures are not allowed to drive until they have been seizure-free for six months.  Other recommendations include using caution when using heavy equipment or power tools. Avoid working on ladders or at heights. Take showers instead of baths.  Do not swim alone.  Ensure the water temperature is not too high on the home water heater. Do not go swimming alone. Do not lock yourself in a room alone (i.e. bathroom). When caring for infants or small children, sit down when holding, feeding, or changing them to minimize risk of injury to the child in the event you have a seizure. Maintain good sleep hygiene. Avoid alcohol.  Also recommend adequate sleep, hydration, good diet and minimize stress.   During the Seizure  - First, ensure adequate ventilation and place patients on the floor on their left side  Loosen clothing around the neck and ensure the airway is patent. If the patient is clenching the teeth, do not force the mouth open with any object as this can cause severe damage - Remove all items from the surrounding that can be hazardous. The patient may be  oblivious to what's happening and may not even know what he or she is doing. If the patient is confused and wandering, either gently guide him/her away and block access to outside areas - Reassure the individual and be comforting - Call 911. In most cases, the seizure ends before  EMS arrives. However, there are cases when seizures may last over 3 to 5 minutes. Or the individual may have developed breathing difficulties or severe injuries. If a pregnant patient or a person with diabetes develops a seizure, it is prudent to call an ambulance. - Finally, if the patient does not regain full consciousness, then call EMS. Most patients will remain confused for about 45 to 90 minutes after a seizure, so you must use judgment in calling for help. - Avoid restraints but make sure the patient is in a bed with padded side rails - Place the individual in a lateral position with the neck slightly flexed; this will help the saliva drain from the mouth and prevent the tongue from falling backward - Remove all nearby furniture and other hazards from the area - Provide verbal assurance as the individual is regaining consciousness - Provide the patient with privacy if possible - Call for help and start treatment as ordered by the caregiver   After the Seizure (Postictal Stage)  After a seizure, most patients experience confusion, fatigue, muscle pain and/or a headache. Thus, one should permit the individual to sleep. For the next few days, reassurance is essential. Being calm and helping reorient the person is also of importance.  Most seizures are painless and end spontaneously. Seizures are not harmful to others but can lead to complications such as stress on the lungs, brain and the heart. Individuals with prior lung problems may develop labored breathing and respiratory distress.     Orders Placed This Encounter  Procedures   Ambulatory referral to Neurology   EEG adult     No orders of the defined types  were placed in this encounter.   Return in about 3 months (around 08/10/2021).    Windell Norfolk, MD 05/13/2021, 6:47 PM  [facility] [address] [place], [place] [address] [phone]

## 2021-05-11 NOTE — Procedures (Signed)
    History:  39 yof with seizure like activities, passing out, multiple episodes per day.   EEG classification: Awake and drowsy  Description of the recording: The background rhythms of this recording consists of a fairly well modulated medium amplitude alpha rhythm of 9-10 Hz that is reactive to eye opening and closure. As the record progresses, the patient appears to remain in the waking state throughout the recording. Photic stimulation was performed, did not show any abnormalities. Hyperventilation was also performed, did not show any abnormalities. Toward the end of the recording, the patient enters the drowsy state with slight symmetric slowing seen. The patient never enters stage II sleep. No abnormal epileptiform discharges seen during this recording. There was no focal slowing. EKG monitor shows no evidence of cardiac rhythm abnormalities with a heart rate of 60.  Impression: This is a normal EEG recording in the waking and drowsy state. No evidence of interictal epileptiform discharges seen. A normal EEG does not exclude a diagnosis of epilepsy.    Windell Norfolk, MD [facility]

## 2021-05-12 ENCOUNTER — Encounter (HOSPITAL_BASED_OUTPATIENT_CLINIC_OR_DEPARTMENT_OTHER): Payer: Self-pay

## 2021-05-12 ENCOUNTER — Other Ambulatory Visit: Payer: Self-pay

## 2021-05-12 ENCOUNTER — Emergency Department (HOSPITAL_BASED_OUTPATIENT_CLINIC_OR_DEPARTMENT_OTHER)
Admission: EM | Admit: 2021-05-12 | Discharge: 2021-05-12 | Disposition: A | Discharge status: Home / Self Care | Payer: BC Managed Care – PPO | Attending: Emergency Medicine | Admitting: Emergency Medicine

## 2021-05-12 DIAGNOSIS — Z20822 Contact with and (suspected) exposure to covid-19: Secondary | ICD-10-CM | POA: Insufficient documentation

## 2021-05-12 DIAGNOSIS — J45909 Unspecified asthma, uncomplicated: Secondary | ICD-10-CM | POA: Insufficient documentation

## 2021-05-12 DIAGNOSIS — F445 Conversion disorder with seizures or convulsions: Secondary | ICD-10-CM | POA: Diagnosis not present

## 2021-05-12 DIAGNOSIS — R55 Syncope and collapse: Secondary | ICD-10-CM | POA: Insufficient documentation

## 2021-05-12 DIAGNOSIS — G40909 Epilepsy, unspecified, not intractable, without status epilepticus: Secondary | ICD-10-CM | POA: Diagnosis not present

## 2021-05-12 DIAGNOSIS — R9431 Abnormal electrocardiogram [ECG] [EKG]: Secondary | ICD-10-CM | POA: Diagnosis not present

## 2021-05-12 LAB — CBG MONITORING, ED: Glucose-Capillary: 86 mg/dL (ref 70–99)

## 2021-05-12 LAB — RESP PANEL BY RT-PCR (FLU A&B, COVID) ARPGX2
Influenza A by PCR: NEGATIVE
Influenza B by PCR: NEGATIVE
SARS Coronavirus 2 by RT PCR: NEGATIVE

## 2021-05-12 LAB — I-STAT VENOUS BLOOD GAS, ED
Acid-base deficit: 2 mmol/L (ref 0.0–2.0)
Bicarbonate: 22.7 mmol/L (ref 20.0–28.0)
Calcium, Ion: 1.2 mmol/L (ref 1.15–1.40)
HCT: 45 % (ref 36.0–46.0)
Hemoglobin: 15.3 g/dL — ABNORMAL HIGH (ref 12.0–15.0)
O2 Saturation: 68 %
Potassium: 3.8 mmol/L (ref 3.5–5.1)
Sodium: 143 mmol/L (ref 135–145)
TCO2: 24 mmol/L (ref 22–32)
pCO2, Ven: 36.8 mmHg — ABNORMAL LOW (ref 44.0–60.0)
pH, Ven: 7.398 (ref 7.250–7.430)
pO2, Ven: 35 mmHg (ref 32.0–45.0)

## 2021-05-12 LAB — PREGNANCY, URINE: Preg Test, Ur: NEGATIVE

## 2021-05-12 MED ORDER — LORAZEPAM 2 MG/ML IJ SOLN
1.0000 mg | Freq: Once | INTRAMUSCULAR | Status: AC
  Administered 2021-05-12: 1 mg via INTRAVENOUS
  Filled 2021-05-12: qty 1

## 2021-05-12 NOTE — ED Triage Notes (Signed)
Arrives POV non responsive.

## 2021-05-12 NOTE — ED Notes (Signed)
Pt. Demonstrating seizure like activity from the waist down. MD informed, MD applied nail bed pressure to pt's L great toe. Pt. yelled and ceased seizure like activity. Now resting comfortably.

## 2021-05-12 NOTE — ED Notes (Signed)
O2 2L [place]

## 2021-05-12 NOTE — Discharge Instructions (Addendum)
You were evaluated in the Emergency Department and after careful evaluation, we did not find any emergent condition requiring admission or further testing in the hospital.  Your exam/testing today was overall reassuring.  Your blood glucose was normal, your negative for COVID-19 and flu.  You are not pregnant.  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  Recommend routine neurology and PCP follow-up.  Regarding the possibility for toxic mold exposure and possible carbon monoxide exposure, recommend you engage your building management regarding this.  It is less likely that you are exposed to carbon monoxide if you have a working carbon monoxide detector in your home.   Please return to the Emergency Department if you experience any worsening of your condition.  Thank you for allowing [place] to be a part of your care.

## 2021-05-12 NOTE — ED Provider Notes (Signed)
[facility] EMERGENCY DEPARTMENT Provider Note   CSN: [id number] Arrival date & time: 05/12/21  1537     History Chief Complaint  Patient presents with   Loss of Consciousness    Wanda Huynh is a 25 y.o. female.   Loss of Consciousness  25 year old female with a medical history significant for anxiety, depression, asthma, GERD, recent  emergency department visit concerning for possible pseudoseizures, recent EEG with neurology that was normal, presenting to the emergency department with altered mental status and seizure-like activity.  The history is provided by the patient's girlfriend as the patient is currently experiencing pseudoseizure like episodes.  She is intermittently responsive during these episodes and will arouse to painful stimuli, talk and then resume shaking.  Her girlfriend states that they have black mold in their apartment and many people in the apartment have been having headaches, nausea, fatigue.  They are unsure what is going on but thinks something in the apartment is causing him to have symptoms.  On chart review, the patient was last seen in the emergency department on 05/05/2021 during which time she had multiple episodes of passing out and shaking concerning for nonepileptic pseudoseizures.  The patient did leave AGAINST MEDICAL ADVICE after that emergency department visit.  She was seen in clinic with neurology on 05/10/2021 and had a normal EEG on 05/10/2021.  The note from her neurology clinic visit is incomplete but I do see a referral to the EMU for capture and characterization with a return visit.  She has not been started on any AEDs.  Level 5 caveat due to mental status changes.  Past Medical History:  Diagnosis Date   Anxiety    Asthma    Depression    GERD (gastroesophageal reflux disease)     Patient Active Problem List   Diagnosis Date Noted   Depression, recurrent (HCC) 05/01/2021   GAD (generalized anxiety disorder)  05/01/2021   Asthma 02/19/2021   GERD (gastroesophageal reflux disease) 02/19/2021    Past Surgical History:  Procedure Laterality Date   double bunion     HYMENECTOMY     PILONIDAL CYST EXCISION       OB History   No obstetric history on file.     Family History  Problem Relation Age of Onset   Cancer Neg Hx     Social History   Tobacco Use   Smoking status: Never   Smokeless tobacco: Never  Vaping Use   Vaping Use: Never used  Substance Use Topics   Alcohol use: Not Currently   Drug use: Yes    Types: Marijuana    Home Medications Prior to Admission medications   Medication Sig Start Date End Date Taking? Authorizing Provider  albuterol (VENTOLIN HFA) 108 (90 Base) MCG/ACT inhaler Inhale 1 puff into the lungs every 6 (six) hours as needed for wheezing or shortness of breath. 02/19/21   Wendling, Jilda Roche, DO  citalopram (CELEXA) 40 MG tablet Take 1 tablet (40 mg total) by mouth daily. 05/08/21   Sharlene Dory, DO  clonazePAM (KLONOPIN) 1 MG tablet Take 0.5-1 tablets (0.5-1 mg total) by mouth 2 (two) times daily as needed for anxiety. 03/20/21   Sharlene Dory, DO  ketoconazole (NIZORAL) 2 % shampoo Apply topically at bedtime as needed for irritation. 02/19/21   Sharlene Dory, DO  loratadine (CLARITIN) 10 MG tablet Take 10 mg by mouth daily.    [provider]  Norgestimate-Ethinyl Estradiol Triphasic 0.18/0.215/0.25 MG-25 MCG tab Take  1 tablet by mouth daily.    [provider]  pantoprazole (PROTONIX) 40 MG tablet Take 1 tablet (40 mg total) by mouth daily. 02/19/21   Sharlene Dory, DO  promethazine (PHENERGAN) 25 MG tablet Take 1 tablet by mouth every 8 (eight) hours as needed. 02/14/21   [provider]    Allergies    Lactose intolerance (gi)  Review of Systems   Review of Systems  Unable to perform ROS: Mental status change  Cardiovascular:  Positive for syncope.   Physical Exam Updated  Vital Signs BP (!) 104/58   Pulse (!) 52   Temp 98.6 F (37 C) (Oral)   Resp (!) 21   LMP 05/05/2021   SpO2 100%   Physical Exam Vitals and nursing note reviewed.  Constitutional:      Appearance: She is well-developed.     Comments: Disoriented, exhibiting shaking-like episodes, will arouse to painful stimuli  HENT:     Head: Normocephalic and atraumatic.  Eyes:     Conjunctiva/sclera: Conjunctivae normal.     Pupils: Pupils are equal, round, and reactive to light.  Cardiovascular:     Rate and Rhythm: Normal rate and regular rhythm.     Heart sounds: No murmur heard. Pulmonary:     Effort: Pulmonary effort is normal. No respiratory distress.     Breath sounds: Normal breath sounds.  Abdominal:     General: There is no distension.     Palpations: Abdomen is soft.     Tenderness: There is no abdominal tenderness. There is no guarding.  Musculoskeletal:        General: No deformity or signs of injury.     Cervical back: Normal range of motion and neck supple.  Skin:    General: Skin is warm and dry.     Findings: No lesion or rash.  Neurological:     Comments: Exhibiting shaking-like episodes, pupils reactive to light bilaterally, will withdraw to painful stimuli all 4 extremities,    ED Results / Procedures / Treatments   Labs (all labs ordered are listed, but only abnormal results are displayed) Labs Reviewed  I-STAT VENOUS BLOOD GAS, ED - Abnormal; Notable for the following components:      Result Value   pCO2, Ven 36.8 (*)    Hemoglobin 15.3 (*)    All other components within normal limits  RESP PANEL BY RT-PCR (FLU A&B, COVID) ARPGX2  PREGNANCY, URINE  COOXEMETRY PANEL  CBG MONITORING, ED    EKG EKG Interpretation  Date/Time:  Saturday May 12 2021 15:51:21 EST Ventricular Rate:  70 PR Interval:  133 QRS Duration: 96 QT Interval:  379 QTC Calculation: 409 R Axis:   47 Text Interpretation: Sinus rhythm Baseline wander in lead(s) V4 V5 V6  Confirmed by Ernie Avena ([id number]) on 05/12/2021 4:35:35 PM  Radiology No results found.  Procedures Procedures   Medications Ordered in ED Medications  LORazepam (ATIVAN) injection 1 mg (1 mg Intravenous Given 05/12/21 1608)    ED Course  I have reviewed the triage vital signs and the nursing notes.  Pertinent labs & imaging results that were available during my care of the patient were reviewed by me and considered in my medical decision making (see chart for details).    MDM Rules/Calculators/A&P                           25 year old female with a medical history significant for anxiety,  depression, asthma, GERD, recent  emergency department visit concerning for possible pseudoseizures, recent EEG with neurology that was normal, presenting to the emergency department with altered mental status and seizure-like activity.  The history is provided by the patient's girlfriend as the patient is currently experiencing pseudoseizure like episodes.  She is intermittently responsive during these episodes and will arouse to painful stimuli, talk and then resume shaking.  Her girlfriend states that they have black mold in their apartment and many people in the apartment have been having headaches, nausea, fatigue.  They are unsure what is going on but thinks something in the apartment is causing him to have symptoms.  On chart review, the patient was last seen in the emergency department on 05/05/2021 during which time she had multiple episodes of passing out and shaking concerning for nonepileptic pseudoseizures.  The patient did leave AGAINST MEDICAL ADVICE after that emergency department visit.  She was seen in clinic with neurology on 05/10/2021 and had a normal EEG on 05/10/2021.  The note from her neurology clinic visit is incomplete but I do see a referral to the EMU for capture and characterization with a return visit.  She has not been started on any AEDs.  On arrival, the patient was  exhibiting shaking-like episodes consistent with possible pseudoseizure versus seizure.  She did withdraw to painful stimuli and demonstrate increased alertness, opening her eyes and looking around the room after nailbed pressure.  On arrival, the patient's blood glucose was normal at 86.  A VBG was also unremarkable.  Co-oximetry cannot be performed here at this freestanding emergency department and is a send out lab.  Considered carbon monoxide poisoning vs organic dust toxic syndrome from black mold exposure but feel that symptoms are most consistent with pseudoseizures. Feel that carbon monoxide exposure is less likely as the patient has a working carbon monoxide Dispensing optician in her apartment complex.   The patient's seizure-like activity ceased with no intervention.  She was administered 1 mg of Ativan for anxiousness.  She had no tongue biting, no urinary incontinence.    On repeat evaluation, the patient was alert and oriented x3, interacting on her cell phone, neurologically intact, at her baseline.  Urine pregnancy negative.  COVID-19 and influenza tested and collected and resulted negative.  Attempted to send to co-oximetry as this was a send out but unfortunately it could not be sent to an outside lab in time to obtain adequate measurements.  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  Final Clinical Impression(s) / ED Diagnoses Final diagnoses:  Psychogenic nonepileptic seizure    Rx / DC Orders ED Discharge Orders     None        Ernie Avena, MD 05/12/21 1931

## 2021-05-12 NOTE — Progress Notes (Signed)
CO2 pulse ox monitor reading 10. MD to order COOX as a send out.

## 2021-05-12 NOTE — ED Notes (Signed)
Provider at bedside, pt tremulous, answers questions, follows commands.  Family reports issue with other tenants in building complaining of headaches and nausea. Sent VBG

## 2021-05-12 NOTE — ED Notes (Signed)
RT assessed upon arrival to room. SAT 100% on RA, BBS clear

## 2021-05-14 ENCOUNTER — Telehealth: Payer: Self-pay | Admitting: Neurology

## 2021-05-14 NOTE — Telephone Encounter (Signed)
Referral sent to [facility]. Phone: [phone]. °

## 2021-05-25 ENCOUNTER — Other Ambulatory Visit (HOSPITAL_COMMUNITY)
Admission: RE | Admit: 2021-05-25 | Discharge: 2021-05-25 | Disposition: A | Discharge status: Home / Self Care | Payer: BC Managed Care – PPO | Source: Ambulatory Visit | Attending: Neurology | Admitting: Neurology

## 2021-05-25 DIAGNOSIS — Z01818 Encounter for other preprocedural examination: Secondary | ICD-10-CM

## 2021-05-25 DIAGNOSIS — Z01812 Encounter for preprocedural laboratory examination: Secondary | ICD-10-CM | POA: Insufficient documentation

## 2021-05-25 DIAGNOSIS — Z20822 Contact with and (suspected) exposure to covid-19: Secondary | ICD-10-CM | POA: Insufficient documentation

## 2021-05-25 LAB — SARS CORONAVIRUS 2 (TAT 6-24 HRS): SARS Coronavirus 2: NEGATIVE

## 2021-05-28 ENCOUNTER — Inpatient Hospital Stay (HOSPITAL_COMMUNITY)
Admission: RE | Admit: 2021-05-28 | Discharge: 2021-05-29 | DRG: 101 | Disposition: A | Discharge status: Home / Self Care | Payer: BC Managed Care – PPO | Attending: Neurology | Admitting: Neurology

## 2021-05-28 ENCOUNTER — Inpatient Hospital Stay (HOSPITAL_COMMUNITY): Payer: BC Managed Care – PPO

## 2021-05-28 DIAGNOSIS — Z20822 Contact with and (suspected) exposure to covid-19: Secondary | ICD-10-CM | POA: Diagnosis not present

## 2021-05-28 DIAGNOSIS — F32A Depression, unspecified: Secondary | ICD-10-CM | POA: Diagnosis present

## 2021-05-28 DIAGNOSIS — R001 Bradycardia, unspecified: Secondary | ICD-10-CM | POA: Diagnosis not present

## 2021-05-28 DIAGNOSIS — D72829 Elevated white blood cell count, unspecified: Secondary | ICD-10-CM | POA: Diagnosis not present

## 2021-05-28 DIAGNOSIS — E282 Polycystic ovarian syndrome: Secondary | ICD-10-CM | POA: Diagnosis not present

## 2021-05-28 DIAGNOSIS — F1290 Cannabis use, unspecified, uncomplicated: Secondary | ICD-10-CM | POA: Diagnosis present

## 2021-05-28 DIAGNOSIS — K219 Gastro-esophageal reflux disease without esophagitis: Secondary | ICD-10-CM | POA: Diagnosis present

## 2021-05-28 DIAGNOSIS — R569 Unspecified convulsions: Secondary | ICD-10-CM

## 2021-05-28 DIAGNOSIS — F419 Anxiety disorder, unspecified: Secondary | ICD-10-CM | POA: Diagnosis present

## 2021-05-28 DIAGNOSIS — Z79899 Other long term (current) drug therapy: Secondary | ICD-10-CM | POA: Diagnosis not present

## 2021-05-28 DIAGNOSIS — E739 Lactose intolerance, unspecified: Secondary | ICD-10-CM | POA: Diagnosis not present

## 2021-05-28 DIAGNOSIS — R7401 Elevation of levels of liver transaminase levels: Secondary | ICD-10-CM | POA: Diagnosis not present

## 2021-05-28 DIAGNOSIS — G40909 Epilepsy, unspecified, not intractable, without status epilepticus: Secondary | ICD-10-CM | POA: Diagnosis not present

## 2021-05-28 LAB — CBC WITH DIFFERENTIAL/PLATELET
Abs Immature Granulocytes: 0.05 10*3/uL (ref 0.00–0.07)
Basophils Absolute: 0.1 10*3/uL (ref 0.0–0.1)
Basophils Relative: 1 %
Eosinophils Absolute: 0.2 10*3/uL (ref 0.0–0.5)
Eosinophils Relative: 1 %
HCT: 44.2 % (ref 36.0–46.0)
Hemoglobin: 14.7 g/dL (ref 12.0–15.0)
Immature Granulocytes: 0 %
Lymphocytes Relative: 24 %
Lymphs Abs: 2.9 10*3/uL (ref 0.7–4.0)
MCH: 33.6 pg (ref 26.0–34.0)
MCHC: 33.3 g/dL (ref 30.0–36.0)
MCV: 101.1 fL — ABNORMAL HIGH (ref 80.0–100.0)
Monocytes Absolute: 1 10*3/uL (ref 0.1–1.0)
Monocytes Relative: 8 %
Neutro Abs: 7.7 10*3/uL (ref 1.7–7.7)
Neutrophils Relative %: 66 %
Platelets: 311 10*3/uL (ref 150–400)
RBC: 4.37 MIL/uL (ref 3.87–5.11)
RDW: 12 % (ref 11.5–15.5)
WBC: 11.8 10*3/uL — ABNORMAL HIGH (ref 4.0–10.5)
nRBC: 0 % (ref 0.0–0.2)

## 2021-05-28 LAB — PROTIME-INR
INR: 0.9 (ref 0.8–1.2)
Prothrombin Time: 12.3 seconds (ref 11.4–15.2)

## 2021-05-28 LAB — COMPREHENSIVE METABOLIC PANEL
ALT: 76 U/L — ABNORMAL HIGH (ref 0–44)
AST: 42 U/L — ABNORMAL HIGH (ref 15–41)
Albumin: 3.6 g/dL (ref 3.5–5.0)
Alkaline Phosphatase: 69 U/L (ref 38–126)
Anion gap: 6 (ref 5–15)
BUN: 10 mg/dL (ref 6–20)
CO2: 25 mmol/L (ref 22–32)
Calcium: 8.8 mg/dL — ABNORMAL LOW (ref 8.9–10.3)
Chloride: 104 mmol/L (ref 98–111)
Creatinine, Ser: 0.72 mg/dL (ref 0.44–1.00)
GFR, Estimated: >60 mL/min (ref >60)
Glucose, Bld: 87 mg/dL (ref 70–99)
Potassium: 4.2 mmol/L (ref 3.5–5.1)
Sodium: 135 mmol/L (ref 135–145)
Total Bilirubin: 0.6 mg/dL (ref 0.3–1.2)
Total Protein: 6.6 g/dL (ref 6.5–8.1)

## 2021-05-28 LAB — RAPID URINE DRUG SCREEN, HOSP PERFORMED
Amphetamines: NOT DETECTED
Barbiturates: NOT DETECTED
Benzodiazepines: NOT DETECTED
Cocaine: NOT DETECTED
Opiates: NOT DETECTED
Tetrahydrocannabinol: POSITIVE — AB

## 2021-05-28 LAB — MAGNESIUM: Magnesium: 1.9 mg/dL (ref 1.7–2.4)

## 2021-05-28 LAB — GLUCOSE, CAPILLARY: Glucose-Capillary: 92 mg/dL (ref 70–99)

## 2021-05-28 LAB — PHOSPHORUS: Phosphorus: 3.8 mg/dL (ref 2.5–4.6)

## 2021-05-28 MED ORDER — NORGESTIM-ETH ESTRAD TRIPHASIC 0.18/0.215/0.25 MG-25 MCG PO TABS
1.0000 | ORAL_TABLET | Freq: Every day | ORAL | Status: DC
  Administered 2021-05-28: 18:00:00 1 via ORAL

## 2021-05-28 MED ORDER — LABETALOL HCL 5 MG/ML IV SOLN: 10.0000 mg | INTRAVENOUS | Status: DC | PRN

## 2021-05-28 MED ORDER — ACETAMINOPHEN 650 MG RE SUPP: 650.0000 mg | RECTAL | Status: DC | PRN

## 2021-05-28 MED ORDER — PROCHLORPERAZINE EDISYLATE 10 MG/2ML IJ SOLN
5.0000 mg | Freq: Once | INTRAMUSCULAR | Status: AC
  Administered 2021-05-28: 12:00:00 5 mg via INTRAVENOUS
  Filled 2021-05-28: qty 2

## 2021-05-28 MED ORDER — ENOXAPARIN SODIUM 40 MG/0.4ML IJ SOSY
40.0000 mg | PREFILLED_SYRINGE | INTRAMUSCULAR | Status: DC
  Administered 2021-05-29: 40 mg via SUBCUTANEOUS
  Filled 2021-05-28: qty 0.4

## 2021-05-28 MED ORDER — LORAZEPAM 2 MG/ML IJ SOLN: 2.0000 mg | INTRAMUSCULAR | Status: DC | PRN

## 2021-05-28 MED ORDER — KETOROLAC TROMETHAMINE 15 MG/ML IJ SOLN
15.0000 mg | Freq: Once | INTRAMUSCULAR | Status: AC
  Administered 2021-05-28: 12:00:00 15 mg via INTRAVENOUS
  Filled 2021-05-28: qty 1

## 2021-05-28 MED ORDER — SODIUM CHLORIDE 0.9% FLUSH
3.0000 mL | Freq: Two times a day (BID) | INTRAVENOUS | Status: DC
  Administered 2021-05-28 – 2021-05-29 (×2): 3 mL via INTRAVENOUS

## 2021-05-28 MED ORDER — CITALOPRAM HYDROBROMIDE 40 MG PO TABS
40.0000 mg | ORAL_TABLET | Freq: Every day | ORAL | Status: DC
  Administered 2021-05-28: 23:00:00 40 mg via ORAL
  Filled 2021-05-28: qty 1

## 2021-05-28 MED ORDER — ACETAMINOPHEN 325 MG PO TABS: 650.0000 mg | ORAL_TABLET | ORAL | Status: DC | PRN

## 2021-05-28 NOTE — Plan of Care (Signed)
  Problem: Education: Goal: Expressions of having a comfortable level of knowledge regarding the disease process will increase Outcome: Progressing   Problem: Health Behavior/Discharge Planning: Goal: Compliance with prescribed medication regimen will improve Outcome: Progressing   Problem: Medication: Goal: Risk for medication side effects will decrease Outcome: Progressing   Problem: Clinical Measurements: Goal: Complications related to the disease process, condition or treatment will be avoided or minimized Outcome: Progressing Goal: Diagnostic test results will improve Outcome: Progressing   Problem: Safety: Goal: Verbalization of understanding the information provided will improve Outcome: Progressing   Problem: Self-Concept: Goal: Level of anxiety will decrease Outcome: Progressing Goal: Ability to verbalize feelings about condition will improve Outcome: Progressing

## 2021-05-28 NOTE — Progress Notes (Signed)
EMU LTM EEG hooked up and running - no initial skin breakdown - push button tested - neuro notified. [facility] monitoring.

## 2021-05-28 NOTE — H&P (Addendum)
CC: Seizure-like episodes  History is obtained from: Patient, chart review  HPI: Wanda Huynh is a 25 y.o. female with past medical history of anxiety/depression, PCOS, GERD who was admitted to epilepsy monitoring unit for characterization of spells.  History of spells: Patient states he started having the spells suddenly on May 05, 2021.  Initially she was having syncopal events with loss of consciousness multiple times a day.  Gradually, she started having whole body shaking after the syncopal event.  She states initially she was having 10-12 episodes every day, sometimes 4-5 episodes consecutively, each lasting few minutes.  Over the last couple weeks, she has been having 1-3 episodes daily and has even gone couple of days without any episode.  Last episode was this morning around 2 AM.  She states she has had tongue bite at least once during these episodes, denies bowel/bladder incontinence.  Reports at times having a headache prior to the episodes, no other warning signs.  Patient denies any recent change in medications.  Does state that her girlfriend broke up with her about 2 weeks ago.  Also reports she recently found out that there is black mold in the apartment that she lives in.  Patient states she has "stress headaches" at baseline.  However for the last 1 month, she has been having "migraine" headaches described as bifrontal headaches associated with blurred vision in both eyes, lightheadedness, dizziness, out of body feeling, nausea, photophobia and allodynia.  Most of the time the headache starts mild gradually worsens in severity.  States she has a headache right now.  Epilepsy risk factors: Born 2 weeks preterm, had brief 1 week NICU stay, denies meningitis/encephalitis, states she has had a concussion with loss of consciousness just before ninth grade while playing rugby, lost consciousness for about 10 seconds, also states she has been more stridently Gerilyn Nestle has had  multiple falls with concussion but no loss of consciousness, denies family history of epilepsy  Prior, current AEDs: None  CT head without contrast 05/05/2021: Normal CT head   ROS: All other systems reviewed and negative except as noted in the HPI.   Past Medical History:  Diagnosis Date   Anxiety    Asthma    Depression    GERD (gastroesophageal reflux disease)     Family History  Problem Relation Age of Onset   Cancer Neg Hx    Social History:  reports that she has never smoked. She has never used smokeless tobacco. She reports that she does not currently use alcohol. She reports current drug use. Drug: Marijuana.  Medications Prior to Admission  Medication Sig Dispense Refill Last Dose   albuterol (VENTOLIN HFA) 108 (90 Base) MCG/ACT inhaler Inhale 1 puff into the lungs every 6 (six) hours as needed for wheezing or shortness of breath. 18 g 2    citalopram (CELEXA) 40 MG tablet Take 1 tablet (40 mg total) by mouth daily. 30 tablet 3    clonazePAM (KLONOPIN) 1 MG tablet Take 0.5-1 tablets (0.5-1 mg total) by mouth 2 (two) times daily as needed for anxiety. 30 tablet 1    ketoconazole (NIZORAL) 2 % shampoo Apply topically at bedtime as needed for irritation. 120 mL 0    loratadine (CLARITIN) 10 MG tablet Take 10 mg by mouth daily.      Norgestimate-Ethinyl Estradiol Triphasic 0.18/0.215/0.25 MG-25 MCG tab Take 1 tablet by mouth daily.      pantoprazole (PROTONIX) 40 MG tablet Take 1 tablet (40 mg total) by mouth daily.  30 tablet 1    promethazine (PHENERGAN) 25 MG tablet Take 1 tablet by mouth every 8 (eight) hours as needed.         Exam: Current vital signs: BP 116/74 (BP Location: Right Arm)   Pulse 71   Temp 98.1 F (36.7 C) (Oral)   Resp 18   LMP 05/05/2021   SpO2 98%  Vital signs in last 24 hours: Temp:  [98.1 F (36.7 C)] 98.1 F (36.7 C) (11/28 0944) Pulse Rate:  [71] 71 (11/28 0944) Resp:  [18] 18 (11/28 0944) BP: (116)/(74) 116/74 (11/28 0944) SpO2:   [98 %] 98 % (11/28 0944)   Physical Exam  Constitutional: Appears well-developed and well-nourished.  Psych: Affect appropriate to situation Eyes: No scleral injection HENT: No OP obstrucion Head: Normocephalic.  Cardiovascular: Normal rate and regular rhythm.  Respiratory: Effort normal, non-labored breathing GI: Soft.  No distension. There is no tenderness.  Skin: Warm, no rash Neuro: AOx3, cranial nerves 2-12 grossly intact, 5/5 in all 4 extremities   I have reviewed labs in epic and the results pertinent to this consultation are: CBC:  Recent Labs  Lab 05/28/21 1005  WBC 11.8*  NEUTROABS 7.7  HGB 14.7  HCT 44.2  MCV 101.1*  PLT 311    Basic Metabolic Panel:  Lab Results  Component Value Date   NA 135 05/28/2021   K 4.2 05/28/2021   CO2 25 05/28/2021   GLUCOSE 87 05/28/2021   BUN 10 05/28/2021   CREATININE 0.72 05/28/2021   CALCIUM 8.8 (L) 05/28/2021   GFRNONAA >60 05/28/2021   GFRAA >60 07/11/2018   Lipid Panel:  Lab Results  Component Value Date   LDLCALC 117 (H) 02/19/2021   HgbA1c: No results found for: HGBA1C Urine Drug Screen:     Component Value Date/Time   LABOPIA NONE DETECTED 05/28/2021 1004   COCAINSCRNUR NONE DETECTED 05/28/2021 1004   LABBENZ NONE DETECTED 05/28/2021 1004   AMPHETMU NONE DETECTED 05/28/2021 1004   THCU POSITIVE (A) 05/28/2021 1004   LABBARB NONE DETECTED 05/28/2021 1004    Alcohol Level No results found for: ETH   I have reviewed the images obtained:  CT head without contrast: No acute abnormality.  ASSESSMENT/PLAN: 25 year old female with new onset seizure-like episodes.  Seizure-like episodes -The semiology of the episodes is most likely consistent with nonepileptic events -We will obtain video EEG for characterization of spells -Patient not on any AEDs currently -We will likely perform HV and photic stimulation tomorrow for seizure provocation -As needed IV Ativan 2 mg clinical seizure-like activity  Status  migrainosus - IV toradol and compazine  Anxiety/depression -Continue home Celexa  Thank you for allowing [place] to participate in the care of this patient. If you have any further questions, please contact  me or neurohospitalist.   Lindie Spruce Epilepsy [facility]

## 2021-05-29 DIAGNOSIS — D72829 Elevated white blood cell count, unspecified: Secondary | ICD-10-CM | POA: Diagnosis present

## 2021-05-29 DIAGNOSIS — R569 Unspecified convulsions: Secondary | ICD-10-CM

## 2021-05-29 DIAGNOSIS — R7401 Elevation of levels of liver transaminase levels: Secondary | ICD-10-CM | POA: Diagnosis present

## 2021-05-29 DIAGNOSIS — F1290 Cannabis use, unspecified, uncomplicated: Secondary | ICD-10-CM | POA: Diagnosis present

## 2021-05-29 MED ORDER — NORGESTIM-ETH ESTRAD TRIPHASIC 0.18/0.215/0.25 MG-25 MCG PO TABS: 1.0000 | ORAL_TABLET | Freq: Every day | ORAL | Status: DC

## 2021-05-29 NOTE — Discharge Instructions (Signed)
You were admitted to EMU from 05/28/2021 to 05/29/2021. During this period your EEG was within normal limits. Your episodes are most likely non-epileptic events. However, we didn't get to capture an event as you wished to leave against medical advice ( AMA). Normal EEG doesn't exclude the diagnosis of epilepsy. Cognitive behavioral therapy has been shown to help with non-epileptic events. However, if theses episodes persist, please contact outpatient neurologist and consider repeat EMU admission. Follow seizure precautions including DO NOT drive until cleared by physician.   You were also noted to have low heart rate. If this persists, recommend cardiology follow up.  You were noted to have transaminitis. You chose to left AMA before workup was complete. The risks of leaving were discussed with you. Recommend follow up with PCP as soon as possible and return to ER if any concerns.

## 2021-05-29 NOTE — Progress Notes (Signed)
Notified by RN that tele team notified HR in 40s. Pt remains asymptomatic. Continue tele and LTM EEG Consider cardiology consult if deemed appropriate in the AM.  -- Milon Dikes, MD Neurologist [facility] Pager: [phone]

## 2021-05-29 NOTE — Plan of Care (Signed)
  Problem: Coping: Goal: Ability to adjust to condition or change in health will improve 05/29/2021 1817 by Melvenia Needles, RN Outcome: Adequate for Discharge 05/29/2021 1817 by Melvenia Needles, RN Outcome: Progressing Goal: Ability to identify appropriate support needs will improve 05/29/2021 1817 by Melvenia Needles, RN Outcome: Adequate for Discharge 05/29/2021 1817 by Melvenia Needles, RN Outcome: Progressing   Problem: Health Behavior/Discharge Planning: Goal: Compliance with prescribed medication regimen will improve 05/29/2021 1817 by Melvenia Needles, RN Outcome: Adequate for Discharge 05/29/2021 1817 by Melvenia Needles, RN Outcome: Progressing   Problem: Clinical Measurements: Goal: Complications related to the disease process, condition or treatment will be avoided or minimized 05/29/2021 1817 by Melvenia Needles, RN Outcome: Adequate for Discharge 05/29/2021 1817 by Melvenia Needles, RN Outcome: Progressing Goal: Diagnostic test results will improve 05/29/2021 1817 by Melvenia Needles, RN Outcome: Adequate for Discharge 05/29/2021 1817 by Melvenia Needles, RN Outcome: Progressing   Problem: Self-Concept: Goal: Level of anxiety will decrease 05/29/2021 1817 by Melvenia Needles, RN Outcome: Adequate for Discharge 05/29/2021 1817 by Melvenia Needles, RN Outcome: Progressing Goal: Ability to verbalize feelings about condition will improve 05/29/2021 1817 by Melvenia Needles, RN Outcome: Adequate for Discharge 05/29/2021 1817 by Melvenia Needles, RN Outcome: Progressing

## 2021-05-29 NOTE — Procedures (Signed)
Patient Name: [name]  MRN: [id number]  Epilepsy Attending: Charlsie Quest  Referring Physician/Provider: Dr Lindie Spruce Duration: 05/28/2021 1131 to 05/29/2021 1131  Patient history: 25 year old female with new onset seizure-like episodes admitted to [facility] for characterization pf spells.   Level of alertness: Awake, asleep  AEDs during EEG study: None  Technical aspects: This EEG study was done with scalp electrodes positioned according to the 10-20 International system of electrode placement. Electrical activity was acquired at a sampling rate of 500Hz  and reviewed with a high frequency filter of 70Hz  and a low frequency filter of 1Hz . EEG data were recorded continuously and digitally stored.   Description: The posterior dominant rhythm consists of 9-10 Hz activity of moderate voltage (25-35 uV) seen predominantly in posterior head regions, symmetric and reactive to eye opening and eye closing. Sleep was characterized by vertex waves, sleep spindles (12 to 14 Hz), maximal frontocentral region.   Hyperventilation and photic stimulation were not performed.     IMPRESSION: This study is within normal limits. No seizures or epileptiform discharges were seen throughout the recording.  Nella Botsford 

## 2021-05-29 NOTE — Discharge Summary (Addendum)
Physician Discharge Summary  Patient ID: Trenyce Loera MRN: [id number] DOB/AGE: August 10, 1995 25 y.o.  Admit date: 05/28/2021 Discharge date: 05/29/2021  Admission Diagnoses: seizure  Discharge Diagnoses:  Non-epileptic events/pseudoseizure Active Problems:   Transaminitis   Marijuana use, continuous   Leucocytosis   Discharged Condition: stable  Hospital Course: Ms Clodfelter was admitted to EMU from 05/28/2021 to 05/29/2021. During this period she had continuous eeg monitoring. EEG was within normal limits. The semiology of episodes is highly concerning for non-epileptic events. However, we didn't get to capture any event as patient wished to leave against medical advice ( AMA). Normal EEG doesn't exclude the diagnosis of epilepsy. Cognitive behavioral therapy has been shown to help with non-epileptic events. However, if theses episodes persist, please contact outpatient neurologist and consider repeat EMU admission. Follow seizure precautions including DO NOT drive until cleared by physician.   Of note, per review of chart, patient was started on Wellbutrin on 05/01/2021 after which the seizure-like episode started.  Wellbutrin dose was reduced on 05/08/2021 and patient states her episodes have been improving for the past couple of weeks.  Wellbutrin is known to lower seizure threshold.  However description of her episodes is atypical for epileptic seizures.     Patient was also noted to have low heart rate. If this persists, recommend cardiology follow up.  Patient was noted to have transaminitis. Patient chose to left AMA before workup was complete. The risks of leaving were discussed in detail. Recommend follow up with PCP as soon as possible and return to ER if any concerns.  Patient was counseled against marijuana use.   Consults: None  Significant Diagnostic Studies:   EEG:    Description: The posterior dominant rhythm consists of 9-10 Hz activity of moderate  voltage (25-35 uV) seen predominantly in posterior head regions, symmetric and reactive to eye opening and eye closing. Sleep was characterized by vertex waves, sleep spindles (12 to 14 Hz), maximal frontocentral region.   Hyperventilation and photic stimulation were not performed.      IMPRESSION: This study is within normal limits. No seizures or epileptiform discharges were seen throughout the recording.   Laquesha Holcomb Annabelle Harman     Treatments: Continue home meds  Discharge Exam: Blood pressure 104/62, pulse 64, temperature 98.5 F (36.9 C), temperature source Oral, resp. rate 17, last menstrual period 05/05/2021, SpO2 98 %.  General: lying in bed, NAD CVS: pulse-normal rate and rhythm RS: breathing comfortably, CTAB Extremities: warm, no edema Neuro: AOx3, cranial nerves 2-12 grossly intact, 5/5 in all 4 extremities    Disposition: Discharge disposition: 01-Home or Self Care       Discharge Instructions     Diet - low sodium heart healthy   Complete by: As directed    Discharge instructions   Complete by: As directed    Seizure precautions: Per [facility] statutes, patients with seizures are not allowed to drive until they have been seizure-free for six months and cleared by a physician    Use caution when using heavy equipment or power tools. Avoid working on ladders or at heights. Take showers instead of baths. Ensure the water temperature is not too high on the home water heater. Do not go swimming alone. Do not lock yourself in a room alone (i.e. bathroom). When caring for infants or small children, sit down when holding, feeding, or changing them to minimize risk of injury to the child in the event you have a seizure. Maintain good sleep hygiene. Avoid alcohol.  If patient has another seizure, call 911 and bring them back to the ED if: A.  The seizure lasts longer than 5 minutes.      B.  The patient doesn't wake shortly after the seizure or has new problems such  as difficulty seeing, speaking or moving following the seizure C.  The patient was injured during the seizure D.  The patient has a temperature over 102 F (39C) E.  The patient vomited during the seizure and now is having trouble breathing    During the Seizure   - First, ensure adequate ventilation and place patients on the floor on their left side  Loosen clothing around the neck and ensure the airway is patent. If the patient is clenching the teeth, do not force the mouth open with any object as this can cause severe damage - Remove all items from the surrounding that can be hazardous. The patient may be oblivious to what's happening and may not even know what he or she is doing. If the patient is confused and wandering, either gently guide him/her away and block access to outside areas - Reassure the individual and be comforting - Call 911. In most cases, the seizure ends before EMS arrives. However, there are cases when seizures may last over 3 to 5 minutes. Or the individual may have developed breathing difficulties or severe injuries. If a pregnant patient or a person with diabetes develops a seizure, it is prudent to call an ambulance. - Finally, if the patient does not regain full consciousness, then call EMS. Most patients will remain confused for about 45 to 90 minutes after a seizure, so you must use judgment in calling for help. - Avoid restraints but make sure the patient is in a bed with padded side rails - Place the individual in a lateral position with the neck slightly flexed; this will help the saliva drain from the mouth and prevent the tongue from falling backward - Remove all nearby furniture and other hazards from the area - Provide verbal assurance as the individual is regaining consciousness - Provide the patient with privacy if possible - Call for help and start treatment as ordered by the caregiver    After the Seizure (Postictal Stage)   After a seizure, most  patients experience confusion, fatigue, muscle pain and/or a headache. Thus, one should permit the individual to sleep. For the next few days, reassurance is essential. Being calm and helping reorient the person is also of importance.   Most seizures are painless and end spontaneously. Seizures are not harmful to others but can lead to complications such as stress on the lungs, brain and the heart. Individuals with prior lung problems may develop labored breathing and respiratory distress.   Increase activity slowly   Complete by: As directed       Allergies as of 05/29/2021       Reactions   Lactose Intolerance (gi)    diarrhea         Medication List     TAKE these medications    albuterol 108 (90 Base) MCG/ACT inhaler Commonly known as: VENTOLIN HFA Inhale 1 puff into the lungs every 6 (six) hours as needed for wheezing or shortness of breath.   citalopram 40 MG tablet Commonly known as: CELEXA Take 1 tablet (40 mg total) by mouth daily.   clonazePAM 1 MG tablet Commonly known as: KLONOPIN Take 0.5-1 tablets (0.5-1 mg total) by mouth 2 (two) times daily as needed for  anxiety.   ketoconazole 2 % shampoo Commonly known as: NIZORAL Apply topically at bedtime as needed for irritation.   loratadine 10 MG tablet Commonly known as: CLARITIN Take 10 mg by mouth daily.   Norgestimate-Ethinyl Estradiol Triphasic 0.18/0.215/0.25 MG-25 MCG tab Take 1 tablet by mouth daily.   pantoprazole 40 MG tablet Commonly known as: PROTONIX Take 1 tablet (40 mg total) by mouth daily.   promethazine 25 MG tablet Commonly known as: PHENERGAN Take 1 tablet by mouth every 6 (six) hours as needed for nausea or vomiting.       I have spent a total of  40  minutes with the patient reviewing hospital notes,  test results, labs and examining the patient as well as establishing an assessment and plan that was discussed personally with the patient.  > 50% of time was spent in direct patient  care.       Signed: Charlsie Quest 05/29/2021, 6:16 PM

## 2021-05-29 NOTE — Plan of Care (Signed)
  Problem: Education: Goal: Expressions of having a comfortable level of knowledge regarding the disease process will increase Outcome: Progressing   Problem: Coping: Goal: Ability to adjust to condition or change in health will improve Outcome: Progressing   Problem: Health Behavior/Discharge Planning: Goal: Compliance with prescribed medication regimen will improve Outcome: Progressing   Problem: Safety: Goal: Verbalization of understanding the information provided will improve Outcome: Progressing   

## 2021-05-29 NOTE — Progress Notes (Signed)
Wanda Huynh D/C'd Home per MD order, and this is because patient insisted on leaving against medical advice despite convicting  patient to stay and finish the EMU study. Discussed with the patient and all questions fully answered.  VSS, Skin clean, dry and intact without evidence of skin break down, no evidence of skin tears noted. IV catheter discontinued intact. Site without signs and symptoms of complications. Dressing and pressure applied.  D/c education completed with patient including follow up instructions, medication list, d/c activities limitations if indicated, with other d/c instructions as indicated by MD - patient able to verbalize understanding, all questions fully answered.   Patient instructed to return to ED, call 911, or call MD for any changes in condition.   Patient ambulated and was escorted to the elevator at 1820. Patient states " my best friend is picking me up in private auto."  Melvenia Needles 05/29/2021 6:25 PM

## 2021-05-29 NOTE — Plan of Care (Signed)
  Problem: Education: Goal: Expressions of having a comfortable level of knowledge regarding the disease process will increase Outcome: Progressing   Problem: Coping: Goal: Ability to adjust to condition or change in health will improve Outcome: Progressing Goal: Ability to identify appropriate support needs will improve Outcome: Progressing   Problem: Health Behavior/Discharge Planning: Goal: Compliance with prescribed medication regimen will improve Outcome: Progressing   Problem: Clinical Measurements: Goal: Complications related to the disease process, condition or treatment will be avoided or minimized Outcome: Progressing Goal: Diagnostic test results will improve Outcome: Progressing

## 2021-05-29 NOTE — Progress Notes (Signed)
Paged Dr Wilford Corner to report that tele has called a few times to report HR is sustaining off and on in the mid to upper 40s.  Was 48 when this nurse entered the room.  Pt is asymptomatic and resting comfortably.  Pt said her HR does not ever dip down into the 40s that she is aware of.  Said it does dip into the mid 50s at times that she knows of.  Dr Wilford Corner said he would leave a note for Dr Melynda Ripple so she can address it in the morning if she thinks any further intervention needs to be taken, since she is asymptomatic at this time.

## 2021-05-29 NOTE — Progress Notes (Signed)
EEG maint complete.  ?

## 2021-05-29 NOTE — Progress Notes (Signed)
Subjective: No seizures overnight  ROS: negative except above  Examination  Vital signs in last 24 hours: Temp:  [97.8 F (36.6 C)-98.2 F (36.8 C)] 98.2 F (36.8 C) (11/29 0355) Pulse Rate:  [62-79] 64 (11/29 0355) Resp:  [14-19] 19 (11/28 2347) BP: (100-119)/(61-64) 100/61 (11/28 2347) SpO2:  [98 %-100 %] 100 % (11/29 0355)  General: lying in bed, NAD CVS: pulse-normal rate and rhythm RS: breathing comfortably, CTAB Extremities: warm, no edema Neuro: AOx3, cranial nerves 2-12 grossly intact, 5/5 in all 4 extremities  Basic Metabolic Panel: Recent Labs  Lab 05/28/21 1005  NA 135  K 4.2  CL 104  CO2 25  GLUCOSE 87  BUN 10  CREATININE 0.72  CALCIUM 8.8*  MG 1.9  PHOS 3.8    CBC: Recent Labs  Lab 05/28/21 1005  WBC 11.8*  NEUTROABS 7.7  HGB 14.7  HCT 44.2  MCV 101.1*  PLT 311     Coagulation Studies: Recent Labs    05/28/21 1005  LABPROT 12.3  INR 0.9    Imaging Reviewed:     ASSESSMENT AND PLAN: 25 year old female with new onset seizure-like episodes.   Seizure-like episodes -The semiology of the episodes is most likely consistent with nonepileptic events -Of note, per review of chart, patient was started on Wellbutrin on 05/01/2021 after which the seizure-like episode started.  Wellbutrin dose was reduced on 05/08/2021 and patient states her episodes have been improving for the past couple of weeks.  Wellbutrin is known to lower seizure threshold.  However description of her episodes is atypical for epileptic seizures.    Recommendations: -Continue video EEG for characterization of spells -Patient not on any AEDs currently -Will perform HV and photic stimulation tomorrow for seizure provocation -As needed IV Ativan 2 mg clinical seizure-like activity   Anxiety/depression -Continue home Celexa  Sinus bradycardia -Patient's heart rate dropped to 40s while she was asleep last night. - Patient has had prior EKGs with normal sinus rhythm.   Most recent EKG yesterday showed normal sinus rhythm with sinus arrhythmia. -Discussed with cardiology Dr. Wyline Mood.  As long as patient is not bradycardic when she is awake and has increasing heart rate with activity, no further inpatient work-up required.  However if episodes persist, would recommend outpatient cardiology follow-up.  Transaminitis -Unclear etiology, uptrending since last visit.  Patient denies alcohol use.  Could be secondary to marijuana use.  We will also order hepatitis panel.  If negative, recommend follow-up with PCP for further work-up.  Leukocytosis -Patient is afebrile, does not look septic, will trend WBCs and if uptrending will order further infectious work-up  Cannabis use disorder -Substance abuse counseling done  I have spent a total of  36 minutes with the patient reviewing hospital notes,  test results, labs and examining the patient as well as establishing an assessment and plan that was discussed personally with the patient.  > 50% of time was spent in direct patient care.   Lindie Spruce Epilepsy [facility] For questions after 5pm please refer to AMION to reach the Neurologist on call

## 2021-05-30 NOTE — Procedures (Signed)
Patient Name: Wanda Huynh  MRN: [id number]  Epilepsy Attending: Charlsie Quest  Referring Physician/Provider: Dr Lindie Spruce Duration: 05/29/2021 1131 to 05/29/2021 1805   Patient history: 25 year old female with new onset seizure-like episodes admitted to EMU for characterization pf spells.    Level of alertness: Awake, asleep   AEDs during EEG study: None   Technical aspects: This EEG study was done with scalp electrodes positioned according to the 10-20 International system of electrode placement. Electrical activity was acquired at a sampling rate of 500Hz  and reviewed with a high frequency filter of 70Hz  and a low frequency filter of 1Hz . EEG data were recorded continuously and digitally stored.    Description: The posterior dominant rhythm consists of 9-10 Hz activity of moderate voltage (25-35 uV) seen predominantly in posterior head regions, symmetric and reactive to eye opening and eye closing. Sleep was characterized by vertex waves, sleep spindles (12 to 14 Hz), maximal frontocentral region.   Hyperventilation and photic stimulation were not performed.      IMPRESSION: This study is within normal limits. No seizures or epileptiform discharges were seen throughout the recording.   [name] 

## 2021-06-11 ENCOUNTER — Encounter: Payer: Self-pay | Admitting: Family Medicine

## 2021-06-28 ENCOUNTER — Ambulatory Visit: Payer: BC Managed Care – PPO | Admitting: Neurology

## 2021-06-28 ENCOUNTER — Encounter: Payer: Self-pay | Admitting: Neurology

## 2021-06-28 VITALS — BP 107/65 | HR 60 | Ht 67.0 in | Wt 146.5 lb

## 2021-06-28 DIAGNOSIS — F445 Conversion disorder with seizures or convulsions: Secondary | ICD-10-CM

## 2021-06-28 NOTE — Progress Notes (Signed)
[facility]  PATIENT: Wanda Huynh DOB: Jun 02, 1996  REQUESTING CLINICIAN: Sharlene Huynh* HISTORY FROM: Patient and father  REASON FOR VISIT: Seizure like activity    HISTORICAL  CHIEF COMPLAINT:  Chief Complaint  Patient presents with   Follow-up    Rm 13. Accompanied by girlfriend. Pt states she had a seizure on 05/27/2021.    INTERVAL HISTORY 06/28/2021:  Patient present today for follow-up, at last visit plan was for a EMU admission.  He was admitted to the EMU, no events were captured and the EEG was normal.  She left AGAINST MEDICAL ADVICE to continue recording.  Since discharge from EMU admission she does not report any additional events.  Currently she does not have any question or concern.  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     EMU Discharge summary Wanda Huynh was admitted to EMU from 05/28/2021 to 05/29/2021. During this period she had continuous eeg monitoring. EEG was within normal limits. The semiology of episodes is highly concerning for non-epileptic events. However, we didn't get to capture any event as patient wished to leave against medical advice ( AMA). Normal EEG doesn't exclude the diagnosis of epilepsy. Cognitive behavioral therapy has been shown to help with non-epileptic events. However, if theses episodes persist, please contact outpatient neurologist and consider repeat EMU admission. Follow seizure precautions including DO NOT drive until cleared by physician.    Of note, per review of chart, patient was started on Wellbutrin on 05/01/2021 after which the seizure-like episode started. Wellbutrin dose was reduced on 05/08/2021 and patient states her episodes have been improving for the past couple of weeks. Wellbutrin is known to lower seizure threshold.  However description of her episodes is atypical for epileptic seizures.      Patient was also noted to have low heart rate. If this persists, recommend cardiology follow up.   Patient was noted to have transaminitis. Patient chose to left AMA before workup was complete. The risks of leaving were discussed in detail. Recommend follow up with PCP as soon as possible and return to ER if any concerns.   Patient was counseled against marijuana use.   HISTORY OF PRESENT ILLNESS:  This is a 25 year old woman with past medical history of Anxiety/Depression, PCOS and GERD who is presenting for evaluation of new onset seizure like activity. Patient states that activities started about 2 weeks ago, at that time, she was having 10-12 episodes per day. She will feel scared, then pass out, was told that she shaked with the passing out. Sometimes, she will be unconscious, other, she can hear her surrounding. Was also told that she tenses up and twitches. Afterward she will feel tired and lightheaded, with headaches and sensitivity to light. Lately she is having out of body experiences, like she can see herself seize, and also a feeling of floating when she is walking. Currently she is having 4 to 5 episodes per day, reports lots of pain coming out of seizures. She also reports  a previous history of syncope.  She was seen in the ED 11/5 for the same, episodes were witnessed was were suspicious for nonepileptic seizures, she was recommended admission for EMU but left against medical advise. She had followed up with her PMD on 11/8 and her Celexa was increased to 40 mg daily.    Handedness: right handed   Seizure Type: ?unclear, passing out, tensing  up and shaking.   Current frequency: 4 to 5 episodes per day.  Any injuries from seizures: No injuries, reports tongue biting   Seizure risk factors: None reported, born at 38 weeks, has to have UV light treatment   Previous ASMs: None   Currenty ASMs: None   ASMs side effects: None   Brain Images: Negative 05/05/2021  Previous  EEGs: None    OTHER MEDICAL CONDITIONS: PCOS, Anxiety/Depression, GERD   REVIEW OF SYSTEMS: Full 14 system review of systems performed and negative with exception of: as noted in the HPI  ALLERGIES: Allergies  Allergen Reactions   Lactose Intolerance (Gi)     diarrhea     HOME MEDICATIONS: Outpatient Medications Prior to Visit  Medication Sig Dispense Refill   albuterol (VENTOLIN HFA) 108 (90 Base) MCG/ACT inhaler Inhale 1 puff into the lungs every 6 (six) hours as needed for wheezing or shortness of breath. 18 g 2   citalopram (CELEXA) 40 MG tablet Take 1 tablet (40 mg total) by mouth daily. 30 tablet 3   clonazePAM (KLONOPIN) 1 MG tablet Take 0.5-1 tablets (0.5-1 mg total) by mouth 2 (two) times daily as needed for anxiety. 30 tablet 1   ketoconazole (NIZORAL) 2 % shampoo Apply topically at bedtime as needed for irritation. 120 mL 0   loratadine (CLARITIN) 10 MG tablet Take 10 mg by mouth daily.     Norgestimate-Ethinyl Estradiol Triphasic 0.18/0.215/0.25 MG-25 MCG tab Take 1 tablet by mouth daily.     promethazine (PHENERGAN) 25 MG tablet Take 1 tablet by mouth every 6 (six) hours as needed for nausea or vomiting.     pantoprazole (PROTONIX) 40 MG tablet Take 1 tablet (40 mg total) by mouth daily. 30 tablet 1   No facility-administered medications prior to visit.    PAST MEDICAL HISTORY: Past Medical History:  Diagnosis Date   Anxiety    Asthma    Depression    GERD (gastroesophageal reflux disease)     PAST SURGICAL HISTORY: Past Surgical History:  Procedure Laterality Date   double bunion     HYMENECTOMY     PILONIDAL CYST EXCISION      FAMILY HISTORY: Family History  Problem Relation Age of Onset   Cancer Neg Hx     SOCIAL HISTORY: Social History   Socioeconomic History   Marital status: Single    Spouse name: Not on file   Number of children: Not on file   Years of education: Not on file   Highest education level: Not on file  Occupational History    Not on file  Tobacco Use   Smoking status: Never   Smokeless tobacco: Never  Vaping Use   Vaping Use: Never used  Substance and Sexual Activity   Alcohol use: Not Currently   Drug use: Yes    Types: Marijuana   Sexual activity: Yes    Partners: Female  Other Topics Concern   Not on file  Social History Narrative   Not on file   Social Determinants of Health   Financial Resource Strain: Not on file  Food Insecurity: Not on file  Transportation Needs: Not on file  Physical Activity: Not on file  Stress: Not on file  Social Connections: Not on file  Intimate Partner Violence: Not on file    PHYSICAL EXAM  GENERAL EXAM/CONSTITUTIONAL: Vitals:  Vitals:   06/28/21 1248  BP: 107/65  Pulse: 60  Weight: 146 lb 8 oz (66.5 kg)  Height: 5\' 7"  (1.702  m)    Body mass index is 22.95 kg/m. Wt Readings from Last 3 Encounters:  06/28/21 146 lb 8 oz (66.5 kg)  05/10/21 153 lb (69.4 kg)  05/05/21 163 lb (73.9 kg)   Patient is in no distress; well developed, nourished and groomed; neck is supple  EYES: Pupils round and reactive to light, Visual fields full to confrontation, Extraocular movements intacts,  No results found.  MUSCULOSKELETAL: Gait, strength, tone, movements noted in Neurologic exam below  NEUROLOGIC: MENTAL STATUS:  No flowsheet data found. awake, alert, oriented to person, place and time recent and remote memory intact normal attention and concentration language fluent, comprehension intact, naming intact fund of knowledge appropriate  CRANIAL NERVE: 2nd, 3rd, 4th, 6th - pupils equal and reactive to light, visual fields full to confrontation, extraocular muscles intact, no nystagmus 5th - facial sensation symmetric 7th - facial strength symmetric 8th - hearing intact 9th - palate elevates symmetrically, uvula midline 11th - shoulder shrug symmetric 12th - tongue protrusion midline  MOTOR:  normal bulk and tone, full strength in the BUE,  BLE  SENSORY:  normal and symmetric to light touch, pinprick, temperature, vibration  COORDINATION:  finger-nose-finger, fine finger movements normal  REFLEXES:  deep tendon reflexes present and symmetric  GAIT/STATION:  normal   DIAGNOSTIC DATA (LABS, IMAGING, TESTING) - I reviewed patient records, labs, notes, testing and imaging myself where available.  Lab Results  Component Value Date   WBC 11.8 (H) 05/28/2021   HGB 14.7 05/28/2021   HCT 44.2 05/28/2021   MCV 101.1 (H) 05/28/2021   PLT 311 05/28/2021      Component Value Date/Time   NA 135 05/28/2021 1005   K 4.2 05/28/2021 1005   CL 104 05/28/2021 1005   CO2 25 05/28/2021 1005   GLUCOSE 87 05/28/2021 1005   BUN 10 05/28/2021 1005   CREATININE 0.72 05/28/2021 1005   CALCIUM 8.8 (L) 05/28/2021 1005   PROT 6.6 05/28/2021 1005   ALBUMIN 3.6 05/28/2021 1005   AST 42 (H) 05/28/2021 1005   ALT 76 (H) 05/28/2021 1005   ALKPHOS 69 05/28/2021 1005   BILITOT 0.6 05/28/2021 1005   GFRNONAA >60 05/28/2021 1005   GFRAA >60 07/11/2018 0749   Lab Results  Component Value Date   CHOL 189 02/19/2021   HDL 58.10 02/19/2021   LDLCALC 117 (H) 02/19/2021   TRIG 65.0 02/19/2021   No results found for: HGBA1C No results found for: VITAMINB12 Lab Results  Component Value Date   TSH 1.268 07/11/2018    Head CT 05/05/2021: Normal Head CT  Routine EEG normal 05/10/2021  I personally reviewed brain Images.      ASSESSMENT AND PLAN  25 y.o. year old female  with depression, anxiety, PCOS who is presenting for follow-up for her seizure-like activity.  She had a routine EEG which was normal.  She was admitted to the EMU, EEG has been normal, no events were captured.  She left early against advice Since discharge from the hospital, she denies any additional seizure-like activity.  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  I have discussed the diagnosis with her  and recommend referral to psychiatry.  She is comfortable with plans.  I also advised her to continue following up with her primary care doctor and return if worse.   1. Psychogenic nonepileptic seizure     Patient Instructions  Continue current medications  OK to resume driving  Referral to psychiatry  Follow up with your PCP and return if worse     Per [facility] statutes, patients with seizures are not allowed to drive until they have been seizure-free for six months.  Other recommendations include using caution when using heavy equipment or power tools. Avoid working on ladders or at heights. Take showers instead of baths.  Do not swim alone.  Ensure the water temperature is not too high on the home water heater. Do not go swimming alone. Do not lock yourself in a room alone (i.e. bathroom). When caring for infants or small children, sit down when holding, feeding, or changing them to minimize risk of injury to the child in the event you have a seizure. Maintain good sleep hygiene. Avoid alcohol.  Also recommend adequate sleep, hydration, good diet and minimize stress.   During the Seizure  - First, ensure adequate ventilation and place patients on the floor on their left side  Loosen clothing around the neck and ensure the airway is patent. If the patient is clenching the teeth, do not force the mouth open with any object as this can cause severe damage - Remove all items from the surrounding that can be hazardous. The patient may be oblivious to what's happening and may not even know what he or she is doing. If the patient is confused and wandering, either gently guide him/her away and block access to outside areas - Reassure the individual and be comforting - Call 911. In most cases, the seizure ends before EMS arrives. However, there are cases when seizures may last over 3 to 5 minutes. Or the individual may have developed breathing difficulties or severe injuries. If a  pregnant patient or a person with diabetes develops a seizure, it is prudent to call an ambulance. - Finally, if the patient does not regain full consciousness, then call EMS. Most patients will remain confused for about 45 to 90 minutes after a seizure, so you must use judgment in calling for help. - Avoid restraints but make sure the patient is in a bed with padded side rails - Place the individual in a lateral position with the neck slightly flexed; this will help the saliva drain from the mouth and prevent the tongue from falling backward - Remove all nearby furniture and other hazards from the area - Provide verbal assurance as the individual is regaining consciousness - Provide the patient with privacy if possible - Call for help and start treatment as ordered by the caregiver   After the Seizure (Postictal Stage)  After a seizure, most patients experience confusion, fatigue, muscle pain and/or a headache. Thus, one should permit the individual to sleep. For the next few days, reassurance is essential. Being calm and helping reorient the person is also of importance.  Most seizures are painless and end spontaneously. Seizures are not harmful to others but can lead to complications such as stress on the lungs, brain and the heart. Individuals with prior lung problems may develop labored breathing and respiratory distress.     Orders Placed This Encounter  Procedures   Ambulatory referral to Psychiatry     No orders of the defined types were placed in this encounter.    Return if symptoms worsen or fail to improve.    Windell Norfolk, MD 06/28/2021, 4:58 PM  [facility] [address] [place], [place] [address] [phone]

## 2021-06-28 NOTE — Patient Instructions (Signed)
Continue current medications  OK to resume driving  Referral to psychiatry  Follow up with your PCP and return if worse

## 2021-07-23 ENCOUNTER — Encounter: Payer: Self-pay | Admitting: *Deleted

## 2021-07-23 ENCOUNTER — Telehealth: Payer: Self-pay | Admitting: Neurology

## 2021-07-23 NOTE — Telephone Encounter (Signed)
At 9:47 this morning, pt left a vm asking for a note for employer stating she is ok to return to work.  Pt has a note that she is ok to drive but needs one for work, please call.

## 2021-07-23 NOTE — Telephone Encounter (Signed)
Following letter sent to mychart:  To Whom It May Concern,   Ms. Wanda Huynh has completed her neurological evaluation. She was last seen 06/28/21. 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    If you have any questions or concerns, please don't hesitate to call.   Sincerely,  Windell Norfolk, MD

## 2021-07-23 NOTE — Telephone Encounter (Signed)
She was last seen 06/28/21. Plan as follows:  Return if symptoms worsen or fail to improve. Psychogenic nonepileptic seizure        Continue current medications  OK to resume driving  Referral to psychiatry  Follow up with your PCP and return if worse      _____________________________________  I spoke to the patient. 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 Her employer is asking for a note stating it is okay.  Letter written and she will retreive it from her mychart account.

## 2021-07-23 NOTE — Telephone Encounter (Signed)
Thank you :)

## 2021-07-30 ENCOUNTER — Encounter: Payer: Self-pay | Admitting: Neurology

## 2021-07-31 NOTE — Telephone Encounter (Signed)
I spoke with Wanda Huynh at [facility] and she stated the tinks that referral was sent into a doctors bin, but does not know who. She asked me to refax the referral.  I refaxed it fax # [phone] ph # [phone].

## 2021-08-02 ENCOUNTER — Telehealth: Payer: Self-pay | Admitting: *Deleted

## 2021-08-02 NOTE — Telephone Encounter (Signed)
Wanda Huynh  08/02/21 - 2:26 PM Note Patient left a very nasty voicemail on  my phone stating she has been getting  the run around for her referral for her  seizure medication. The referral was  sent to [facility] but they  are not comfortable enough to treat  this patient.    The patient also stated if she does  not hear back from someone today  by 5 pm, and she has another seizure  then she is going to sue our office.  She also used very foul language.

## 2021-08-02 NOTE — Telephone Encounter (Addendum)
Fourth attempt made to contact patient. If she calls back, we will review the information below.  Dr. Teresa Coombs is dismissing from our practice due to poor behavior towards staff. She will need to request a new psychiatric referral from her PCP. Request sent to Angie.

## 2021-08-02 NOTE — Telephone Encounter (Signed)
Patient left a very nasty voicemail on my phone stating she has been getting the run around for her referral for her seizure medication. The referral was sent to [facility] but they are not comfortable enough to treat this patient.   The patient also stated if she does not hear back from someone today by 5 pm, and she has another seizure then she is going to sue our office. She also used very foul language

## 2021-08-02 NOTE — Telephone Encounter (Signed)
Phone note in Epic. 

## 2021-08-02 NOTE — Telephone Encounter (Signed)
Total of three messages left requesting a return call. Office hours provided.   Need to explain to the patient that her referral was sent to [facility] in a timely manner. That office took time to review her records and declined the referral stating there are no providers willing to treat her psychogenic nonepileptic seizures. Her PCP referred her to our office to rule out neurological seizure events. As a courtesy, Dr. Teresa Coombs went ahead and made the referral to a psychiatrist for her instead of sending her back to her PCP.  Our office does not prescribe any medications for her. They will need to come from her PCP. Additionally, due to her behavior towards staff, Dr. Teresa Coombs will be dismissing her from our office. She will need to discuss further referrals to psychiatric care with her PCP.

## 2021-08-02 NOTE — Telephone Encounter (Signed)
Left message for a return call

## 2021-08-06 ENCOUNTER — Encounter: Payer: Self-pay | Admitting: Neurology

## 2021-08-09 ENCOUNTER — Ambulatory Visit: Payer: BC Managed Care – PPO | Admitting: Neurology

## 2021-08-27 DIAGNOSIS — N3000 Acute cystitis without hematuria: Secondary | ICD-10-CM | POA: Diagnosis not present

## 2021-08-27 DIAGNOSIS — G40409 Other generalized epilepsy and epileptic syndromes, not intractable, without status epilepticus: Secondary | ICD-10-CM | POA: Diagnosis not present

## 2021-08-27 DIAGNOSIS — R519 Headache, unspecified: Secondary | ICD-10-CM | POA: Diagnosis not present

## 2021-08-27 DIAGNOSIS — F445 Conversion disorder with seizures or convulsions: Secondary | ICD-10-CM | POA: Diagnosis not present

## 2021-08-27 DIAGNOSIS — R55 Syncope and collapse: Secondary | ICD-10-CM | POA: Diagnosis not present

## 2021-08-27 DIAGNOSIS — R112 Nausea with vomiting, unspecified: Secondary | ICD-10-CM | POA: Diagnosis not present

## 2021-08-27 DIAGNOSIS — R42 Dizziness and giddiness: Secondary | ICD-10-CM | POA: Diagnosis not present

## 2021-09-30 DIAGNOSIS — S29012A Strain of muscle and tendon of back wall of thorax, initial encounter: Secondary | ICD-10-CM | POA: Diagnosis not present

## 2021-09-30 DIAGNOSIS — M25571 Pain in right ankle and joints of right foot: Secondary | ICD-10-CM | POA: Diagnosis not present

## 2021-09-30 DIAGNOSIS — S93401A Sprain of unspecified ligament of right ankle, initial encounter: Secondary | ICD-10-CM | POA: Diagnosis not present

## 2021-09-30 DIAGNOSIS — S299XXA Unspecified injury of thorax, initial encounter: Secondary | ICD-10-CM | POA: Diagnosis not present

## 2021-09-30 DIAGNOSIS — M549 Dorsalgia, unspecified: Secondary | ICD-10-CM | POA: Diagnosis not present

## 2021-09-30 DIAGNOSIS — S99921A Unspecified injury of right foot, initial encounter: Secondary | ICD-10-CM | POA: Diagnosis not present

## 2021-09-30 DIAGNOSIS — S99911A Unspecified injury of right ankle, initial encounter: Secondary | ICD-10-CM | POA: Diagnosis not present

## 2021-09-30 DIAGNOSIS — Y92410 Unspecified street and highway as the place of occurrence of the external cause: Secondary | ICD-10-CM | POA: Diagnosis not present

## 2021-12-06 DIAGNOSIS — F419 Anxiety disorder, unspecified: Secondary | ICD-10-CM | POA: Diagnosis not present

## 2021-12-06 DIAGNOSIS — F32A Depression, unspecified: Secondary | ICD-10-CM | POA: Diagnosis not present

## 2021-12-06 DIAGNOSIS — Z730 Burn-out: Secondary | ICD-10-CM | POA: Diagnosis not present

## 2021-12-06 DIAGNOSIS — F430 Acute stress reaction: Secondary | ICD-10-CM | POA: Diagnosis not present

## 2021-12-06 DIAGNOSIS — R45851 Suicidal ideations: Secondary | ICD-10-CM | POA: Diagnosis not present

## 2021-12-06 DIAGNOSIS — Z563 Stressful work schedule: Secondary | ICD-10-CM | POA: Diagnosis not present

## 2021-12-06 DIAGNOSIS — Z658 Other specified problems related to psychosocial circumstances: Secondary | ICD-10-CM | POA: Diagnosis not present

## 2021-12-06 DIAGNOSIS — Z634 Disappearance and death of family member: Secondary | ICD-10-CM | POA: Diagnosis not present

## 2021-12-06 DIAGNOSIS — F439 Reaction to severe stress, unspecified: Secondary | ICD-10-CM | POA: Diagnosis not present

## 2021-12-12 DIAGNOSIS — F419 Anxiety disorder, unspecified: Secondary | ICD-10-CM | POA: Diagnosis not present

## 2021-12-17 DIAGNOSIS — F32A Depression, unspecified: Secondary | ICD-10-CM | POA: Diagnosis not present

## 2021-12-26 DIAGNOSIS — F419 Anxiety disorder, unspecified: Secondary | ICD-10-CM | POA: Diagnosis not present

## 2022-01-03 DIAGNOSIS — R112 Nausea with vomiting, unspecified: Secondary | ICD-10-CM | POA: Diagnosis not present

## 2022-01-03 DIAGNOSIS — S0990XA Unspecified injury of head, initial encounter: Secondary | ICD-10-CM | POA: Diagnosis not present

## 2022-01-03 DIAGNOSIS — R220 Localized swelling, mass and lump, head: Secondary | ICD-10-CM | POA: Diagnosis not present

## 2022-01-03 DIAGNOSIS — R519 Headache, unspecified: Secondary | ICD-10-CM | POA: Diagnosis not present

## 2022-01-09 DIAGNOSIS — F419 Anxiety disorder, unspecified: Secondary | ICD-10-CM | POA: Diagnosis not present

## 2022-01-15 DIAGNOSIS — F419 Anxiety disorder, unspecified: Secondary | ICD-10-CM | POA: Diagnosis not present

## 2022-01-29 DIAGNOSIS — F419 Anxiety disorder, unspecified: Secondary | ICD-10-CM | POA: Diagnosis not present

## 2022-06-27 ENCOUNTER — Telehealth: Payer: Self-pay

## 2022-06-27 NOTE — Telephone Encounter (Signed)
Transition Care Management Unsuccessful Follow-up Telephone Call  Date of discharge and from where:  [facility] 06/25/22  Attempts:  1st Attempt  Reason for unsuccessful TCM follow-up call:  No answer/busy

## 2022-06-28 NOTE — Telephone Encounter (Signed)
Transition Care Management Unsuccessful Follow-up Telephone Call  Date of discharge and from where:  [facility], 06/25/22  Attempts:  2nd Attempt  Reason for unsuccessful TCM follow-up call:  No answer/busy

## 2022-07-04 NOTE — Telephone Encounter (Signed)
Transition Care Management Unsuccessful Follow-up Telephone Call  Date of discharge and from where:  [facility], 06/25/22  Attempts:  3rd Attempt  Reason for unsuccessful TCM follow-up call:  No answer/busy

## 2022-10-14 ENCOUNTER — Telehealth: Payer: Self-pay | Admitting: Family Medicine

## 2022-10-14 NOTE — Telephone Encounter (Signed)
[facility] pharmacy called to make PCP aware that they faxed a medication recommendation form to [place]. Fax number was verified and they will be faxing it again. Please advice.   [employer] [phone]

## 2022-10-14 NOTE — Telephone Encounter (Signed)
Patient has not been seen by PCP since 05/08/2021.  Appointment will be needed.

## 2023-05-15 IMAGING — CT CT HEAD W/O CM
3 series · 15 of 47 positions shown, 18 images · non-contrast
Comparison: None.

CLINICAL DATA: Seizure

EXAM:
CT HEAD WITHOUT CONTRAST
TECHNIQUE: Contiguous axial images were obtained from the base of the skull
through the vertex without intravenous contrast.

[Series 2: head wo · axial · 0.46mm/px · z∈[-502,-377]mm · 9 of 31 slices shown, 12 images]
[im 3/31  brain]
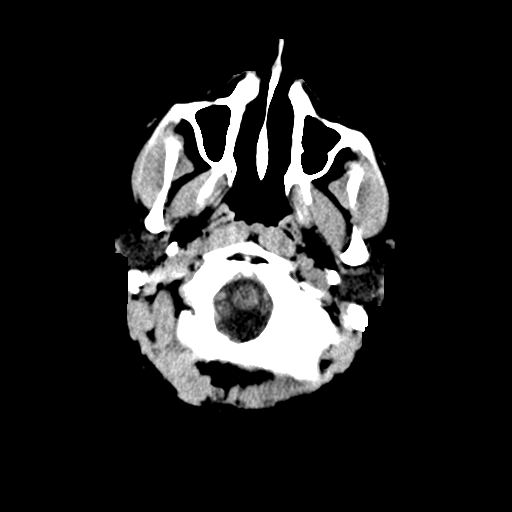
[im 3/31  bone]
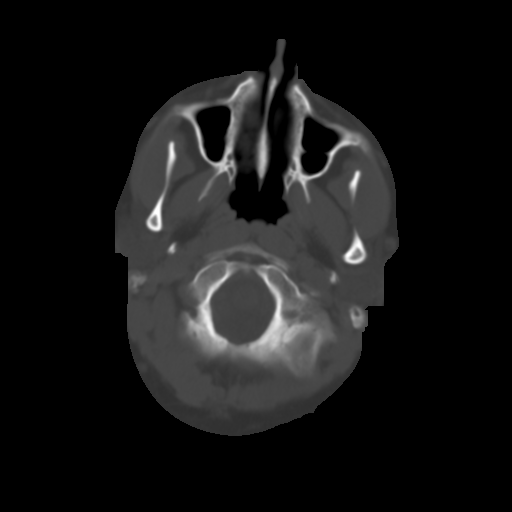
[im 6/31  brain]
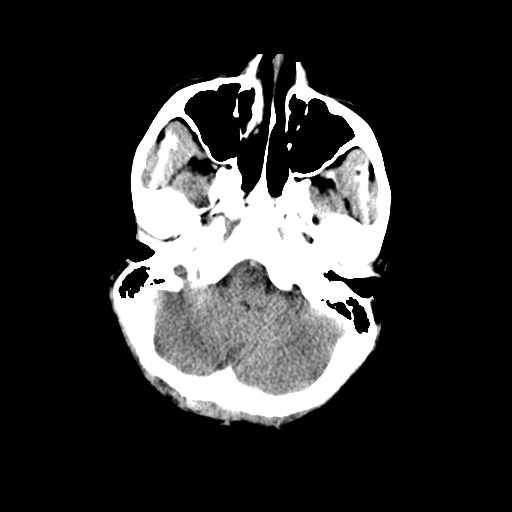
[im 9/31  brain]
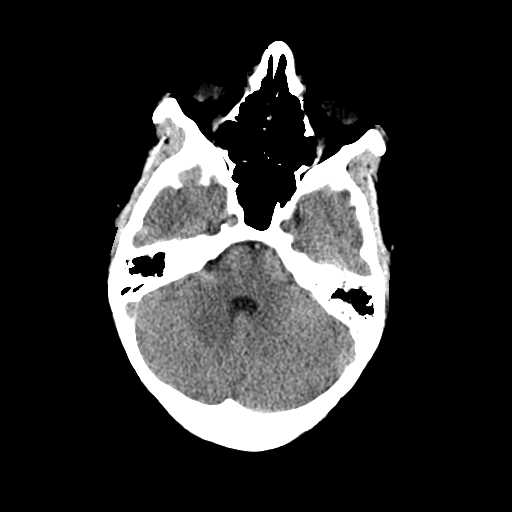
[im 12/31  brain]
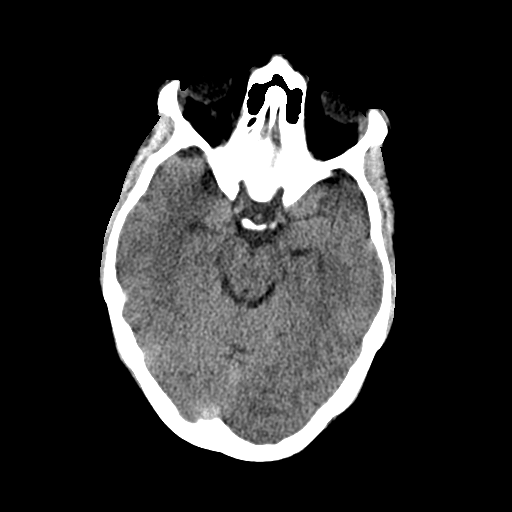
[im 16/31  brain]
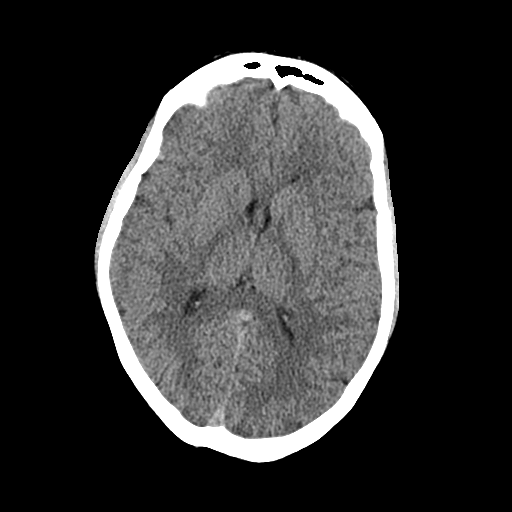
[im 16/31  bone]
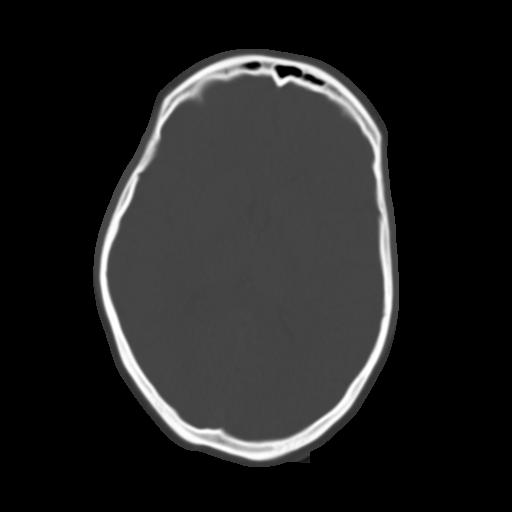
[im 19/31  brain]
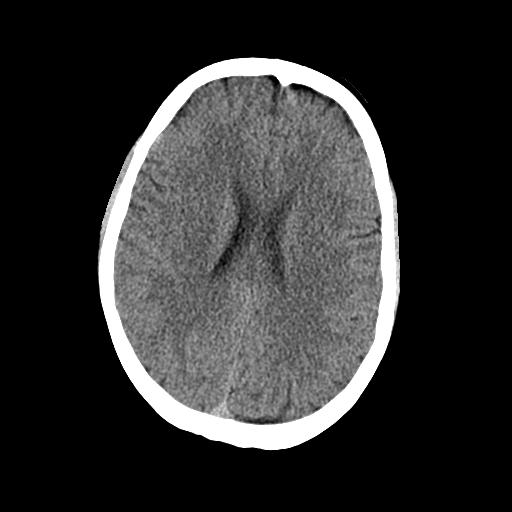
[im 22/31  brain]
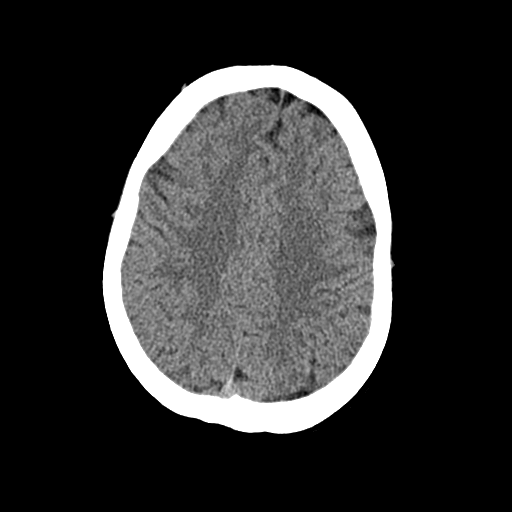
[im 25/31  brain]
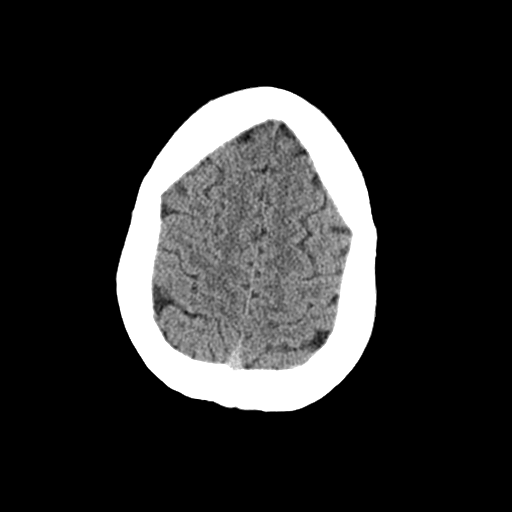
[im 28/31  brain]
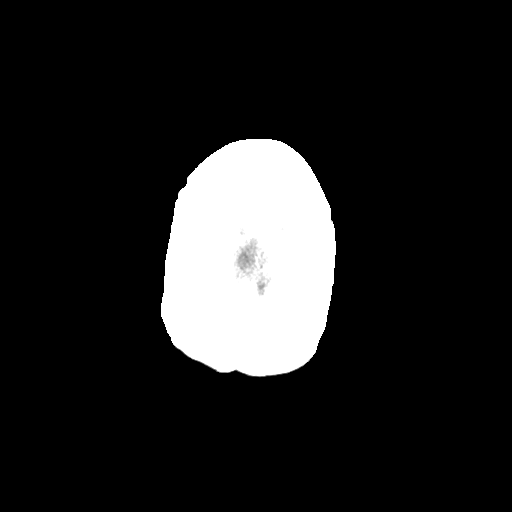
[im 28/31  bone]
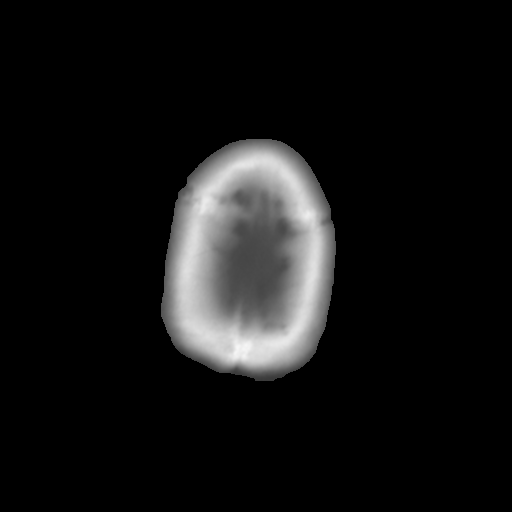

[Series 4: cor soft · coronal · 0.30mm/px · 3 of 71 slices shown]
[im 24/71  brain]
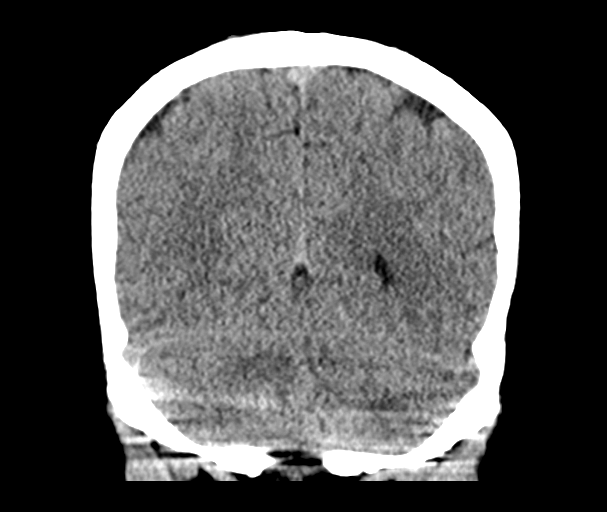
[im 32/71  brain]
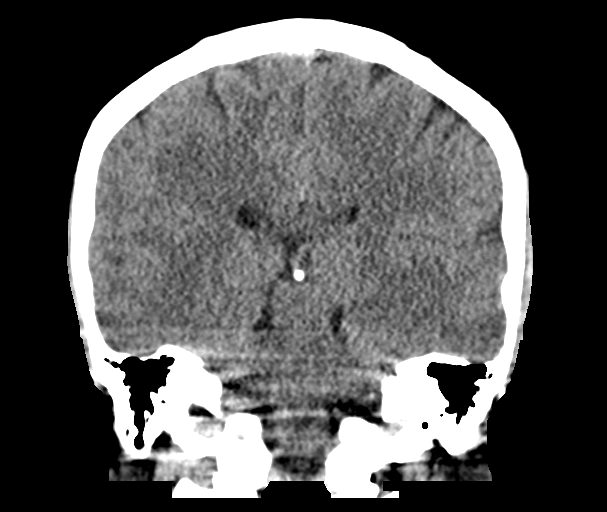
[im 39/71  brain]
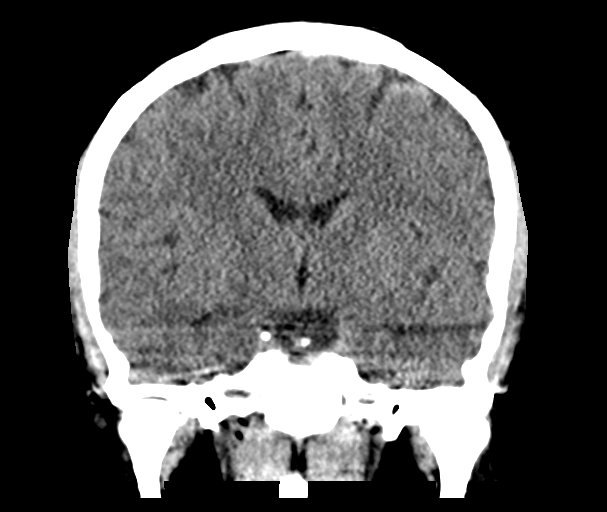

[Series 5: sag soft · sagittal · 0.32mm/px · 3 of 55 slices shown]
[im 19/55  brain]
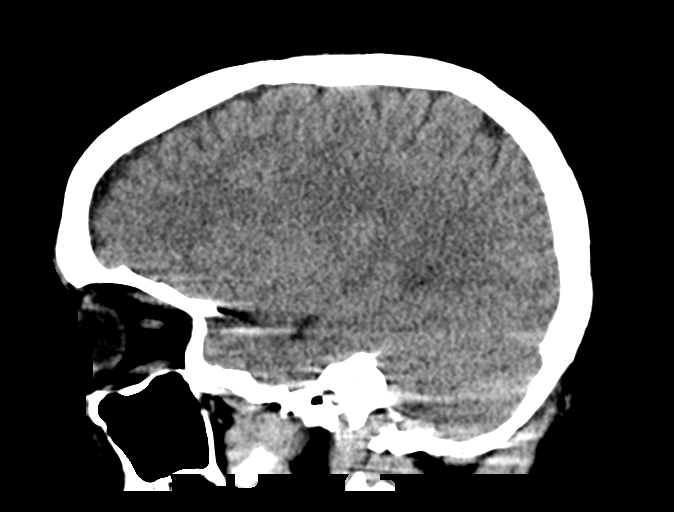
[im 28/55  brain]
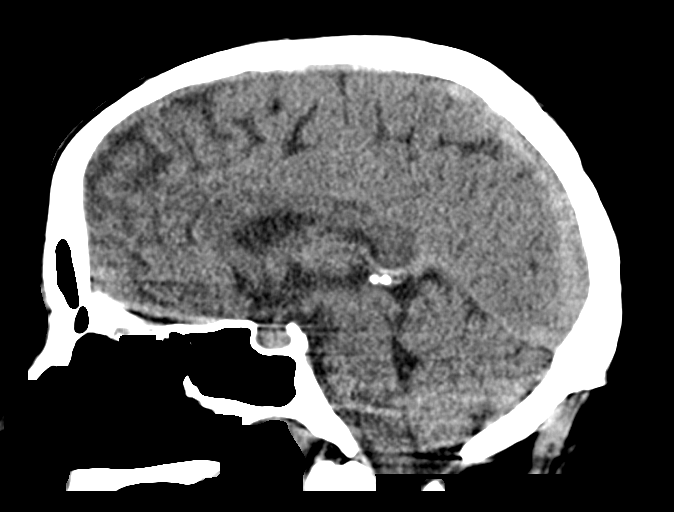
[im 37/55  brain]
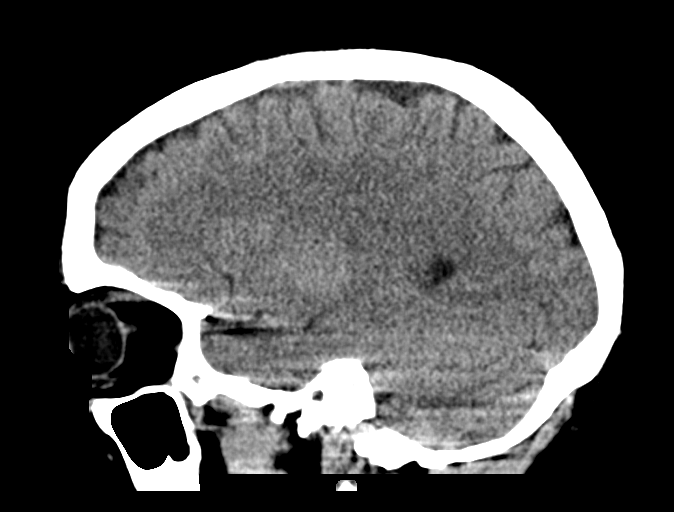

[15 of 47 positions shown; findings below may reference images not displayed]

FINDINGS: Brain: No evidence of acute infarction, hemorrhage, hydrocephalus,
extra-axial collection or mass lesion/mass effect.

Vascular: Negative for hyperdense vessel

Skull: Negative

Sinuses/Orbits: Negative

Other: None
IMPRESSION: Normal CT head
